# Patient Record
Sex: Female | Born: 1975
Health system: Southern US, Community
[De-identification: ages and names within clinical notes are randomized; demographics above are authoritative.]

## PROBLEM LIST (undated history)

## (undated) DIAGNOSIS — J45909 Unspecified asthma, uncomplicated: Secondary | ICD-10-CM

## (undated) HISTORY — DX: Unspecified asthma, uncomplicated: J45.909

## (undated) HISTORY — PX: ENDOMETRIAL ABLATION: SHX621

---

## 1999-12-01 ENCOUNTER — Other Ambulatory Visit: Admission: RE | Admit: 1999-12-01 | Discharge: 1999-12-01 | Payer: Self-pay | Admitting: *Deleted

## 2001-03-20 ENCOUNTER — Other Ambulatory Visit: Admission: RE | Admit: 2001-03-20 | Discharge: 2001-03-20 | Payer: Self-pay | Admitting: *Deleted

## 2002-07-26 ENCOUNTER — Other Ambulatory Visit: Admission: RE | Admit: 2002-07-26 | Discharge: 2002-07-26 | Payer: Self-pay | Admitting: *Deleted

## 2012-02-15 ENCOUNTER — Ambulatory Visit: Payer: Self-pay | Admitting: Family Medicine

## 2012-02-15 DIAGNOSIS — Z0289 Encounter for other administrative examinations: Secondary | ICD-10-CM

## 2014-10-10 ENCOUNTER — Encounter (HOSPITAL_COMMUNITY): Payer: Self-pay | Admitting: Emergency Medicine

## 2014-10-10 ENCOUNTER — Emergency Department (HOSPITAL_COMMUNITY)
Admission: EM | Admit: 2014-10-10 | Discharge: 2014-10-10 | Disposition: A | Discharge status: Home / Self Care | Payer: 59 | Attending: Emergency Medicine | Admitting: Emergency Medicine

## 2014-10-10 DIAGNOSIS — S61411A Laceration without foreign body of right hand, initial encounter: Secondary | ICD-10-CM | POA: Insufficient documentation

## 2014-10-10 DIAGNOSIS — W01198A Fall on same level from slipping, tripping and stumbling with subsequent striking against other object, initial encounter: Secondary | ICD-10-CM | POA: Insufficient documentation

## 2014-10-10 DIAGNOSIS — Z23 Encounter for immunization: Secondary | ICD-10-CM | POA: Diagnosis not present

## 2014-10-10 DIAGNOSIS — Y998 Other external cause status: Secondary | ICD-10-CM | POA: Diagnosis not present

## 2014-10-10 DIAGNOSIS — Y92009 Unspecified place in unspecified non-institutional (private) residence as the place of occurrence of the external cause: Secondary | ICD-10-CM | POA: Insufficient documentation

## 2014-10-10 DIAGNOSIS — Y9389 Activity, other specified: Secondary | ICD-10-CM | POA: Insufficient documentation

## 2014-10-10 MED ORDER — BACITRACIN ZINC 500 UNIT/GM EX OINT
1.0000 "application " | TOPICAL_OINTMENT | Freq: Two times a day (BID) | CUTANEOUS | Status: DC
  Administered 2014-10-10: 1 via TOPICAL
  Filled 2014-10-10: qty 0.9

## 2014-10-10 MED ORDER — LIDOCAINE-EPINEPHRINE 1 %-1:100000 IJ SOLN
10.0000 mL | Freq: Once | INTRAMUSCULAR | Status: AC
  Administered 2014-10-10: 10 mL
  Filled 2014-10-10: qty 1

## 2014-10-10 MED ORDER — TETANUS-DIPHTH-ACELL PERTUSSIS 5-2.5-18.5 LF-MCG/0.5 IM SUSP
0.5000 mL | Freq: Once | INTRAMUSCULAR | Status: AC
  Administered 2014-10-10: 0.5 mL via INTRAMUSCULAR
  Filled 2014-10-10: qty 0.5

## 2014-10-10 NOTE — ED Notes (Signed)
Family at bedside. 

## 2014-10-10 NOTE — ED Notes (Signed)
Patient c/o tripping this am, fell and cut her hand on a metal handle.

## 2014-10-10 NOTE — ED Provider Notes (Signed)
CSN: [id number]     Arrival date & time 10/10/14  0907 History   First MD Initiated Contact with Patient 10/10/14 [phone]     Chief Complaint  Patient presents with  . Fall     (Consider location/radiation/quality/duration/timing/severity/associated sxs/prior Treatment) HPI Comments: 39 year old female who presents with left hand laceration. Just prior to arrival, the patient tripped in her house and fell forward, cutting her hand on a metal handle of a piece of furniture. She did not lose consciousness or sustain any other injuries. She endorses moderate, constant pain at the laceration site. Mild numbness of her right fifth finger but normal range of motion of her fingers and hands. Unknown last tetanus vaccination. She is right-handed.  Patient is a 39 y.o. female presenting with fall. The history is provided by the patient.  Fall    History reviewed. No pertinent past medical history. Past Surgical History  Procedure Laterality Date  . Endometrial ablation     History reviewed. No pertinent family history. Social History  Substance Use Topics  . Smoking status: Never Smoker   . Smokeless tobacco: None  . Alcohol Use: 4.8 oz/week    8 Glasses of wine per week   OB History    No data available     Review of Systems  10 Systems reviewed and are negative for acute change except as noted in the HPI.   Allergies  Sulfa antibiotics  Home Medications   Prior to Admission medications   Not on File   BP 138/93 mmHg  Pulse 72  Temp(Src) 97.8 F (36.6 C) (Oral)  Resp 24  Ht  (1.727 m)  Wt 200 lb (90.719 kg)  BMI 30.42 kg/m2  SpO2 96%  LMP 09/19/2014 (Approximate) Physical Exam  Constitutional: She appears well-developed and well-nourished.  Tearful but NAD  HENT:  Head: Normocephalic and atraumatic.  Nose: Nose normal.  Musculoskeletal:  Normal strength of flexion/extension at DIP and PIP joints of fingers 2-5 R hand, mildly decreased sensation R 5th finger;  2+ radial pulse  Skin:     2.5 cm laceration on ulnar side of R hand 1cm proximal to base of 5th finger, no tendon exposure, hemostatic  Nursing note and vitals reviewed.   ED Course  LACERATION REPAIR Date/Time: 10/10/2014 11:12 AM Performed by: Laurence Spates Authorized by: Laurence Spates Consent: Verbal consent obtained. Risks and benefits: risks, benefits and alternatives were discussed Consent given by: patient Patient understanding: patient states understanding of the procedure being performed Patient consent: the patient's understanding of the procedure matches consent given Procedure consent: procedure consent matches procedure scheduled Patient identity confirmed: verbally with patient Body area: upper extremity Location details: right hand Laceration length: 2.5 cm Foreign bodies: no foreign bodies Tendon involvement: none Nerve involvement: none Vascular damage: no Anesthesia: local infiltration Local anesthetic: lidocaine 1% with epinephrine Anesthetic total: 5 ml Patient sedated: no Preparation: Patient was prepped and draped in the usual sterile fashion. Irrigation solution: saline Irrigation method: jet lavage Amount of cleaning: standard Debridement: moderate Degree of undermining: none Skin closure: 4-0 nylon Subcutaneous closure: 4-0 Vicryl Number of sutures: 8 Technique: simple Approximation: close Approximation difficulty: simple Dressing: antibiotic ointment Patient tolerance: Patient tolerated the procedure well with no immediate complications   (including critical care time) Labs Review Labs Reviewed - No data to display  Medications  bacitracin ointment 1 application (not administered)  Tdap (BOOSTRIX) injection 0.5 mL (0.5 mLs Intramuscular Given 10/10/14 0950)  lidocaine-EPINEPHrine (XYLOCAINE W/EPI) 1 %-1:100000 (  with pres) injection 10 mL (10 mLs Other Given 10/10/14 1610)     MDM   Final diagnoses:  Hand laceration,  right, initial encounter    39 year old female who presents with right hand laceration after she fell against a piece of metal on furniture. Patient with 2.5 cm laceration to ulnar side of the palmar surface of right hand, proximal to the fifth MCP joint. Normal range of motion and strength of all fingers. Slightly diminished sensation of her fifth finger. No evidence of tendon, nerve, or vascular damage. Performed repair at bedside after irrigation and cleaning wound. See his procedure note for details. Updated tetanus vaccination. Reviewed return precautions including signs of infection. Patient voiced understanding. Instructed to follow-up with PCP versus hand surgeon if any concerns. Patient discharged in satisfactory condition.    Laurence Spates, MD 10/10/14 1115

## 2014-10-25 ENCOUNTER — Encounter (HOSPITAL_COMMUNITY): Payer: Self-pay | Admitting: Emergency Medicine

## 2014-10-25 ENCOUNTER — Emergency Department (HOSPITAL_COMMUNITY)
Admission: EM | Admit: 2014-10-25 | Discharge: 2014-10-25 | Disposition: A | Discharge status: Home / Self Care | Payer: 59 | Attending: Emergency Medicine | Admitting: Emergency Medicine

## 2014-10-25 DIAGNOSIS — Z4802 Encounter for removal of sutures: Secondary | ICD-10-CM | POA: Insufficient documentation

## 2014-10-25 NOTE — Discharge Instructions (Signed)
Wash the affected area with soap and water and apply a thin layer of topical antibiotic ointment. Do this every 12 hours.   Do not use rubbing alcohol or hydrogen peroxide.                        Look for signs of infection: if you see redness, if the area becomes warm, if pain increases sharply, there is discharge (pus), if red streaks appear or you develop fever or vomiting, RETURN immediately to the Emergency Department  for a recheck.    Suture Removal, Care After Refer to this sheet in the next few weeks. These instructions provide you with information on caring for yourself after your procedure. Your health care provider may also give you more specific instructions. Your treatment has been planned according to current medical practices, but problems sometimes occur. Call your health care provider if you have any problems or questions after your procedure. WHAT TO EXPECT AFTER THE PROCEDURE After your stitches (sutures) are removed, it is typical to have the following:  Some discomfort and swelling in the wound area.  Slight redness in the area. HOME CARE INSTRUCTIONS   If you have skin adhesive strips over the wound area, do not take the strips off. They will fall off on their own in a few days. If the strips remain in place after 14 days, you may remove them.  Change any bandages (dressings) at least once a day or as directed by your health care provider. If the bandage sticks, soak it off with warm, soapy water.  Apply cream or ointment only as directed by your health care provider. If using cream or ointment, wash the area with soap and water 2 times a day to remove all the cream or ointment. Rinse off the soap and pat the area dry with a clean towel.  Keep the wound area dry and clean. If the bandage becomes wet or dirty, or if it develops a bad smell, change it as soon as possible.  Continue to protect the wound from injury.  Use sunscreen when out in the sun. New scars become  sunburned easily. SEEK MEDICAL CARE IF:  You have increasing redness, swelling, or pain in the wound.  You see pus coming from the wound.  You have a fever.  You notice a bad smell coming from the wound or dressing.  Your wound breaks open (edges not staying together).   This information is not intended to replace advice given to you by your health care provider. Make sure you discuss any questions you have with your health care provider.   Document Released: 09/28/2000 Document Revised: 10/24/2012 Document Reviewed: 08/15/2012 Elsevier Interactive Patient Education [employer]

## 2014-10-25 NOTE — ED Provider Notes (Signed)
CSN: [id number]     Arrival date & time 10/25/14  1410 History  By signing my name below, I, Julia Randall, attest that this documentation has been prepared under the direction and in the presence of [employer], PA-C. Electronically Signed: Elon Randall ED Scribe. 10/25/2014. 2:38 PM.    Chief Complaint  Patient presents with  . Suture / Staple Removal   The history is provided by the patient. No language interpreter was used.   HPI Comments: Julia Randall is a 39 y.o. female who presents to the Emergency Department requesting removal of 8 sutures on the left palm placed on 9/23 in the ED.  The patient denies any pain or drainage but reports her pinky still feels as if it is asleep.  She has kept the complaint clean with soap and water as well as used daily neosporin and bacitracin.   History reviewed. No pertinent past medical history. Past Surgical History  Procedure Laterality Date  . Endometrial ablation     History reviewed. No pertinent family history. Social History  Substance Use Topics  . Smoking status: Never Smoker   . Smokeless tobacco: None  . Alcohol Use: 4.8 oz/week    8 Glasses of wine per week   OB History    No data available     Review of Systems  A complete 10 system review of systems was obtained and all systems are negative except as noted in the HPI and PMH.    Allergies  Sulfa antibiotics  Home Medications   Prior to Admission medications   Not on File   BP 131/89 mmHg  Pulse 75  Temp(Src) 98 F (36.7 C) (Oral)  Resp 18  Ht  (1.727 m)  Wt 200 lb (90.719 kg)  BMI 30.42 kg/m2  SpO2 96%  LMP 10/11/2014 Physical Exam  Constitutional: She is oriented to person, place, and time. She appears well-developed and well-nourished. No distress.  HENT:  Head: Normocephalic and atraumatic.  Eyes: Conjunctivae and EOM are normal.  Neck: Neck supple. No tracheal deviation present.  Cardiovascular: Normal rate.   Pulmonary/Chest: Effort  normal. No respiratory distress.  Musculoskeletal: Normal range of motion.  Neurological: She is alert and oriented to person, place, and time.  Skin: Skin is warm and dry.  8 vicryl sutures in place to right hand on the ulnar side.  Clean dry and intact.   Neurovacularly intact with full ROM and strength to each interphalangeal joint (tested in isolation) in both flexion and extension.    Psychiatric: She has a normal mood and affect. Her behavior is normal.  Nursing note and vitals reviewed.   ED Course  Procedures (including critical care time) DIAGNOSTIC STUDIES: Oxygen Saturation is 96% on RA, normal by my interpretation.    COORDINATION OF CARE:  2:34 PM Will perform suture removal.  Patient acknowledges and agrees with plan.    Labs Review Labs Reviewed - No data to display  Imaging Review No results found. I have personally reviewed and evaluated these images and lab results as part of my medical decision-making.   EKG Interpretation None      MDM   Final diagnoses:  Visit for suture removal    Filed Vitals:   10/25/14 1423  BP: 131/89  Pulse: 75  Temp: 98 F (36.7 C)  TempSrc: Oral  Resp: 18  Height:  (1.727 m)  Weight: 200 lb (90.719 kg)  SpO2: 96%    Julia Randall is a pleasant 39  y.o. female presenting for suture removal. Wound is well healing, no signs of infection patient reports persistent pins and needles paresthesia to fifth digit. Advised her to follow with her primary care physician on that.   Evaluation does not show pathology that would require ongoing emergent intervention or inpatient treatment. Pt is hemodynamically stable and mentating appropriately. Discussed findings and plan with patient/guardian, who agrees with care plan. All questions answered. Return precautions discussed and outpatient follow up given.   I personally performed the services described in this documentation, which was scribed in my presence. The recorded  information has been reviewed and is accurate.      Wynetta Emery, PA-C 10/25/14 1522  Mancel Bale, MD 10/25/14 [phone]

## 2014-10-25 NOTE — ED Notes (Signed)
Pt returned to have sutures removed from rt palm. No redness/swelling noted. (+)PMS, CRT brisk. Edges approximated. No drainage.

## 2016-02-09 ENCOUNTER — Ambulatory Visit (INDEPENDENT_AMBULATORY_CARE_PROVIDER_SITE_OTHER): Payer: 59 | Admitting: Internal Medicine

## 2016-02-09 ENCOUNTER — Ambulatory Visit (INDEPENDENT_AMBULATORY_CARE_PROVIDER_SITE_OTHER)
Admission: RE | Admit: 2016-02-09 | Discharge: 2016-02-09 | Disposition: A | Discharge status: Home / Self Care | Payer: 59 | Source: Ambulatory Visit | Attending: Internal Medicine | Admitting: Internal Medicine

## 2016-02-09 ENCOUNTER — Encounter: Payer: Self-pay | Admitting: Internal Medicine

## 2016-02-09 VITALS — BP 116/72 | HR 77 | Ht 68.0 in | Wt 189.8 lb

## 2016-02-09 DIAGNOSIS — J45991 Cough variant asthma: Secondary | ICD-10-CM

## 2016-02-09 LAB — NITRIC OXIDE: Nitric Oxide: 10

## 2016-02-09 MED ORDER — BUDESONIDE-FORMOTEROL FUMARATE 80-4.5 MCG/ACT IN AERO: 2.0000 | INHALATION_SPRAY | Freq: Two times a day (BID) | RESPIRATORY_TRACT | 0 refills | Status: DC

## 2016-02-09 MED ORDER — BUDESONIDE-FORMOTEROL FUMARATE 80-4.5 MCG/ACT IN AERO: 2.0000 | INHALATION_SPRAY | Freq: Two times a day (BID) | RESPIRATORY_TRACT | 11 refills | Status: DC

## 2016-02-09 NOTE — Patient Instructions (Addendum)
Stop BREO  Plan A = Automatic = symbicort 80 Take 2 puffs first thing in am and then another 2 puffs about 12 hours later.   Plan B = Backup Only use your albuterol as a rescue medication to be used if you can't catch your breath by resting or doing a relaxed purse lip breathing pattern.  - The less you use it, the better it will work when you need it. - Ok to use the inhaler up to 2 puffs  every 4 hours if you must but call for appointment if use goes up over your usual need - Don't leave home without it !!  (think of it like the spare tire for your car)    If cough starts back up when you stop the delsym > Try prilosec otc 20mg   Take 30-60 min before first meal of the day and Pepcid ac (famotidine) 20 mg one @  bedtime until cough is completely gone for at least a week without the need for cough suppression  GERD (REFLUX)  is an extremely common cause of respiratory symptoms just like yours , many times with no obvious heartburn at all.    It can be treated with medication, but also with lifestyle changes including elevation of the head of your bed (ideally with 6 inch  bed blocks),  Smoking cessation, avoidance of late meals, excessive alcohol, and avoid fatty foods, chocolate, peppermint, colas, red wine, and acidic juices such as orange juice.  NO MINT OR MENTHOL PRODUCTS SO NO COUGH DROPS   USE SUGARLESS CANDY INSTEAD (Jolley ranchers or Stover's or Life Savers) or even ice chips will also do - the key is to swallow to prevent all throat clearing. NO OIL BASED VITAMINS - use powdered substitutes.   Please remember to go to the x-ray department downstairs for your tests - we will call you with the results when they are available.     Please schedule a follow up office visit in 6 weeks, call sooner if needed

## 2016-02-09 NOTE — Progress Notes (Signed)
Subjective:    Patient ID: Julia Randall, female    DOB: 1975/11/20,    MRN: [id number]  HPI  53 yowf never regular smoker with lots throat infections as child growing up in [place] but grew out of it HS but by college at [employer] with freq college episodes of bronchitis better back home then moved to [place] permanently in 2011 and pattern of recurrent rhinitis/  Bronchitis initially some better on zyrtec but then stopped working   so eval by [facility] started shots and did fine until right before xmas 2017 with recurrent typical of her bronchtis > rx zpak, prednisone and self- referred to pulmonary clinic    02/09/2016 1st [facility] office visit/ [facility]   maint  On Breo   Chief Complaint  Patient presents with  . Pulm Consult    Per patient, she keeps getting bronchitis. Rate is 3-4 times a year. Has been tested with [facility] and has multiple allergies. Had pneumonia last year as well.   abruptly ill with  Cough and nasal congestio 12/22/17still present p zpak no change day vs noct Also feels tight in chest using albuterol pre ex but was using it once a day at most during flareand didn't know how much she could take it while coughing/wheezing .  Still needing delsym to control daytime coughing.   No obvious day to day or daytime variability or assoc excess/ purulent sputum or mucus plugs or hemoptysis or cp or chest tightness, subjective wheeze or overt   hb symptoms. No unusual exp hx or h/o childhood pna/ asthma or knowledge of premature birth.    Also denies any obvious fluctuation of symptoms with weather or environmental changes or other aggravating or alleviating factors except as outlined above   Current Medications, Allergies, Complete Past Medical History, Past Surgical History, Family History, and Social History were reviewed in [employer] record.               Review of Systems  Constitutional: Negative for fever and  unexpected weight change.  HENT: Positive for congestion. Negative for dental problem, ear pain, nosebleeds, postnasal drip, rhinorrhea, sinus pressure, sneezing, sore throat and trouble swallowing.   Eyes: Negative for redness and itching.  Respiratory: Positive for cough and shortness of breath. Negative for chest tightness and wheezing.   Cardiovascular: Negative for palpitations and leg swelling.  Gastrointestinal: Negative for nausea and vomiting.  Genitourinary: Negative for dysuria.  Musculoskeletal: Negative for joint swelling.  Skin: Negative for rash.  Neurological: Negative for headaches.  Hematological: Does not bruise/bleed easily.  Psychiatric/Behavioral: Negative for dysphoric mood. The patient is nervous/anxious.        Objective:   Physical Exam Pleasant amb wf nad  Wt Readings from Last 3 Encounters:  02/09/16 189 lb 12.8 oz (86.1 kg)  10/25/14 200 lb (90.7 kg)  10/10/14 200 lb (90.7 kg)    Vital signs reviewed - Note on arrival 02 sats  97% on RA    HEENT: nl dentition, turbinates, and oropharynx. Nl external ear canals without cough reflex   NECK :  without JVD/Nodes/TM/ nl carotid upstrokes bilaterally   LUNGS: no acc muscle use,  Nl contour chest with minimal rhonchi  bilaterally with  cough on insp     CV:  RRR  no s3 or murmur or increase in P2, nad no edema   ABD:  soft and nontender with nl inspiratory excursion in the supine position. No bruits or organomegaly  appreciated, bowel sounds nl  MS:  Nl gait/ ext warm without deformities, calf tenderness, cyanosis or clubbing No obvious joint restrictions   SKIN: warm and dry without lesions    NEURO:  alert, approp, nl sensorium with  no motor or cerebellar deficits apparent.     CXR PA and Lateral:   02/09/2016 :    I personally reviewed images and agree with radiology impression as follows:    Mild hyperinflation consistent with known reactive airway disease. No alveolar pneumonia nor other  acute cardiopulmonary abnormality.       Assessment & Plan:

## 2016-02-09 NOTE — Progress Notes (Signed)
Spoke with pt and notified of results per Dr. Wert. Pt verbalized understanding and denied any questions. 

## 2016-02-10 NOTE — Assessment & Plan Note (Signed)
FENO 02/09/2016  =   10 on BREO maint/ done during flare of cough  - Spirometry 02/09/2016  wnl s am breo  - 02/09/2016  After extensive coaching HFA effectiveness =   75% > rec stop breo and start symb 80 2bid  Symptoms are markedly disproportionate to objective findings and not clear this is a lung problem but pt does appear to have difficult airway management issues. DDX of  difficult airways management almost all start with A and  include Adherence, Ace Inhibitors, Acid Reflux, Active Sinus Disease, Alpha 1 Antitripsin deficiency, Anxiety masquerading as Airways dz,  ABPA,  Allergy(esp in young), Aspiration (esp in elderly), Adverse effects of meds,  Active smokers, A bunch of PE's (a small clot burden can't cause this syndrome unless there is already severe underlying pulm or vascular dz with poor reserve) plus two Bs  = Bronchiectasis and Beta blocker use..and one C= CHF   Adherence is always the initial "prime suspect" and is a multilayered concern that requires a "trust but verify" approach in every patient - starting with knowing how to use medications, especially inhalers, correctly, keeping up with refills and understanding the fundamental difference between maintenance and prns vs those medications only taken for a very short course and then stopped and not refilled.   - The proper method of use, as well as anticipated side effects, of a metered-dose inhaler are discussed and demonstrated to the patient. Improved effectiveness after extensive coaching during this visit to a level of approximately 75 % from a baseline of 50 % > ok to use hfa as maint and prn    ? Adverse effects of dpi > try off breo and on low dose ICS = symb 80 2bid   ? Allergy > defer rx to [facility]  ? Acid (or non-acid) GERD > always difficult to exclude as up to 75% of pts in some series report no assoc GI/ Heartburn symptoms> rec next add   max (24h)  acid suppression and diet restrictions/ reviewed and  instructions given in writing.   NB: Of the three most common causes of chronic cough, only one (GERD)  can actually contribute  the other two (asthma and post nasal drip syndrome)  and perpetuate the cylce of cough inducing airway trauma, inflammation, heightened sensitivity to reflux which is prompted by the cough itself via a cyclical mechanism.    This may partially respond to steroids and look like asthma and post nasal drainage but never erradicated completely unless the cough and the secondary reflux are eliminated, preferably both at the same time.  While not intuitively obvious, many patients with chronic low grade reflux do not cough until there is a secondary insult that disturbs the protective epithelial barrier and exposes sensitive nerve endings.  This can be viral or direct physical injury such as with an endotracheal tube.   The point is that once this occurs, it is difficult to eliminate using anything but a maximally effective acid suppression regimen at least in the short run, accompanied by an appropriate diet to address non acid GERD.    ? Active sinus dz > consider sinus ct next   Total time devoted to counseling  > 50 % of 60 min initial office visit:  review case with pt/ discussion of options/alternatives/ personally creating written customized instructions  in presence of pt  then going over those specific  Instructions directly with the pt including how to use all of the meds but in  particular covering each new medication in detail and the difference between the maintenance/automatic meds and the prns using an action plan format for the latter.  Please see AVS from this visit for a full list of these instructions which I personally wrote for this pt and  are unique to this visit.

## 2016-03-09 ENCOUNTER — Encounter: Payer: Self-pay | Admitting: Internal Medicine

## 2016-03-22 ENCOUNTER — Ambulatory Visit: Payer: 59 | Admitting: Internal Medicine

## 2016-03-29 ENCOUNTER — Encounter: Payer: Self-pay | Admitting: Internal Medicine

## 2016-03-29 ENCOUNTER — Ambulatory Visit (INDEPENDENT_AMBULATORY_CARE_PROVIDER_SITE_OTHER): Payer: 59 | Admitting: Internal Medicine

## 2016-03-29 VITALS — BP 114/70 | HR 70 | Ht 65.0 in | Wt 185.6 lb

## 2016-03-29 DIAGNOSIS — J45991 Cough variant asthma: Secondary | ICD-10-CM | POA: Diagnosis not present

## 2016-03-29 MED ORDER — BUDESONIDE-FORMOTEROL FUMARATE 80-4.5 MCG/ACT IN AERO: 2.0000 | INHALATION_SPRAY | Freq: Two times a day (BID) | RESPIRATORY_TRACT | 0 refills | Status: DC

## 2016-03-29 MED ORDER — BUDESONIDE-FORMOTEROL FUMARATE 80-4.5 MCG/ACT IN AERO: INHALATION_SPRAY | RESPIRATORY_TRACT | 11 refills | Status: AC

## 2016-03-29 NOTE — Patient Instructions (Addendum)
Plan A = Automatic = Symbicort 80 2 pffs each am automatically and the pm dose is optional   Work on perfect  inhaler technique:  relax and gently blow all the way out then take a nice smooth deep breath back in, triggering the inhaler at same time you start breathing in.  Hold for up to 5 seconds if you can. Blow out thru nose. Rinse and gargle with water when done - brush teeth and gargle with arm and hammer    Plan B = Backup Only use your albuterol as a rescue medication to be used if you can't catch your breath by resting or doing a relaxed purse lip breathing pattern.  - The less you use it, the better it will work when you need it. - Ok to use the inhaler up to 2 puffs  every 4 hours if you must but call for appointment if use goes up over your usual need - Don't leave home without it !!  (think of it like the spare tire for your car)   If doing great try to drop off prilosec(omeprazole)  and just use pepcid 20 mg twice daily x one week then try off am dose x one week  And stop it completely after another week, resume full acid suppression then start over   Please schedule a follow up visit in 6  months but call sooner if needed

## 2016-03-29 NOTE — Progress Notes (Signed)
Subjective:   Patient ID: Julia Randall, female    DOB: Jun 05, 1975,    MRN: [id number]    Brief patient profile:  [age] yowf never regular smoker with lots throat infections as child growing up in [place] but grew out of it HS but by college at [employer] with freq college episodes of bronchitis better back home then moved to [place] permanently in 2011 and pattern of recurrent rhinitis/  Bronchitis initially some better on zyrtec but then stopped working   so eval by Allergy [facility] ChapelWhelan Pos started shots and did fine until right before xmas 2017 with recurrent typical of her bronchitis > rx zpak, prednisone and self- referred to pulmonary clinic     History of Present Illness  02/09/2016 1st [facility] office visit/ Andren Bethea   maint  On Breo  Chief Complaint  Patient presents with  . Pulm Consult    Per patient, she keeps getting bronchitis. Rate is 3-4 times a year. Has been tested with [facility] and has multiple allergies. Had pneumonia last year as well.   abruptly ill with  Cough and nasal congestio 12/22/17still present p zpak no change day vs noct Also feels tight in chest using albuterol pre ex but was using it once a day at most during flareand didn't know how much she could take it while coughing/wheezing .  Still needing delsym to control daytime coughing. rec Stop BREO Plan A = Automatic = symbicort 80 Take 2 puffs first thing in am and then another 2 puffs about 12 hours later.  Plan B = Backup Only use your albuterol as a rescue medication  If cough starts back up when you stop the delsym > Try prilosec otc 20mg   Take 30-60 min before first meal of the day and Pepcid ac (famotidine) 20 mg one @  bedtime until cough is completely gone for at least a week without the need for cough suppression GERD    Please schedule a follow up office visit in 6 weeks, call sooner if needed     03/29/2016  f/u ov/Joahan Swatzell re: cough variant asthma vs uacs better off breo but thrush on symb 80 2bid  /priolosec/ pepcid Chief Complaint  Patient presents with  . Follow-up    Breathing is doing well and no co's today. She has only used albuterol once since she started on Symbicort. She states she developed thrush after starting Symbicort and was txed with Diflucan by UC.     Not limited by breathing from desired activities  And no need for saba at all while on symb 80/ quite pleased about that  No obvious day to day or daytime variability or assoc excess/ purulent sputum or mucus plugs or hemoptysis or cp or chest tightness, subjective wheeze or overt sinus or hb symptoms. No unusual exp hx or h/o childhood pna/ asthma or knowledge of premature birth.  Sleeping ok without nocturnal  or early am exacerbation  of respiratory  c/o's or need for noct saba. Also denies any obvious fluctuation of symptoms with weather or environmental changes or other aggravating or alleviating factors except as outlined above   Current Medications, Allergies, Complete Past Medical History, Past Surgical History, Family History, and Social History were reviewed in [facility] electronic medical record.  ROS  The following are not active complaints unless bolded sore throat, dysphagia, dental problems, itching, sneezing,  nasal congestion or excess/ purulent secretions, ear ache,   fever, chills, sweats, unintended wt loss, classically pleuritic or exertional  cp,  orthopnea pnd or leg swelling, presyncope, palpitations, abdominal pain, anorexia, nausea, vomiting, diarrhea  or change in bowel or bladder habits, change in stools or urine, dysuria,hematuria,  rash, arthralgias, visual complaints, headache, numbness, weakness or ataxia or problems with walking or coordination,  change in mood/affect or memory.                      Objective:   Physical Exam   Pleasant amb wf nad  03/29/2016       186   02/09/16 189 lb 12.8 oz (86.1 kg)  10/25/14 200 lb (90.7 kg)  10/10/14 200 lb (90.7 kg)    Vital signs  reviewed - Note on arrival 02 sats  99% on RA    HEENT: nl dentition, turbinates, and oropharynx s residual thrush. Nl external ear canals without cough reflex   NECK :  without JVD/Nodes/TM/ nl carotid upstrokes bilaterally   LUNGS: no acc muscle use,  Nl contour chest / clear to A and P    CV:  RRR  no s3 or murmur or increase in P2, nad no edema   ABD:  soft and nontender with nl inspiratory excursion in the supine position. No bruits or organomegaly appreciated, bowel sounds nl  MS:  Nl gait/ ext warm without deformities, calf tenderness, cyanosis or clubbing No obvious joint restrictions   SKIN: warm and dry without lesions    NEURO:  alert, approp, nl sensorium with  no motor or cerebellar deficits apparent.     CXR PA and Lateral:   02/09/2016 :    I personally reviewed images and agree with radiology impression as follows:    Mild hyperinflation consistent with known reactive airway disease. No alveolar pneumonia nor other acute cardiopulmonary abnormality.       Assessment & Plan:

## 2016-03-30 NOTE — Assessment & Plan Note (Signed)
FENO 02/09/2016  =   10 on BREO maint/ done during flare of cough  - Spirometry 02/09/2016  wnl s am breo  - 02/09/2016    75% > rec stop breo and start symb 80 2bid   - 03/29/2016  After extensive coaching HFA effectiveness =    90%   She did not follow instructions re rinsing and blowing symb out through the nose so should add the use of baking soda tootpaste now (despite being a [employer] graduate) to her regimen and reducing the symbicort to 80 x 2 pffs each am and if thrush recurs add spacer  However, note that All goals of chronic asthma control met including optimal function and elimination of symptoms with minimal need for rescue therapy and it may be feasible to gradually wean off the gerd rx if her symptoms remain in such good control.  Contingencies discussed in full including contacting this office immediately if not controlling the symptoms using the rule of two's.     I had an extended discussion with the patient reviewing all relevant studies completed to date and  lasting 15 to 20 minutes of a 25 minute visit    Each maintenance medication was reviewed in detail including most importantly the difference between maintenance and prns and under what circumstances the prns are to be triggered using an action plan format that is not reflected in the computer generated alphabetically organized AVS.    Please see AVS for specific instructions unique to this visit that I personally wrote and verbalized to the the pt in detail and then reviewed with pt  by my nurse highlighting any  changes in therapy recommended at today's visit to their plan of care.

## 2016-09-30 ENCOUNTER — Ambulatory Visit: Payer: 59 | Admitting: Internal Medicine

## 2016-10-18 ENCOUNTER — Encounter: Payer: Self-pay | Admitting: Internal Medicine

## 2016-10-18 ENCOUNTER — Ambulatory Visit (INDEPENDENT_AMBULATORY_CARE_PROVIDER_SITE_OTHER): Payer: 59 | Admitting: Internal Medicine

## 2016-10-18 VITALS — BP 106/80 | HR 66 | Ht 68.0 in | Wt 182.0 lb

## 2016-10-18 DIAGNOSIS — J45991 Cough variant asthma: Secondary | ICD-10-CM

## 2016-10-18 NOTE — Progress Notes (Signed)
Subjective:   Patient ID: Julia Randall, female    DOB: [birth year],    MRN: [id number]   Brief patient profile:  29 yowf never regular smoker with lots throat infections as child growing up in [place] but grew out of it HS but by college at [employer] with freq   episodes of bronchitis better back home then moved to [place] permanently in 2011 and pattern of recurrent rhinitis/  Bronchitis initially some better on zyrtec but then stopped working   so eval by Allergy Barnetta Chapel Pos started shots and did fine until  xmas 2017 with recurrent cough typical of her bronchitis > rx zpak, prednisone and self- referred to pulmonary clinic     History of Present Illness  02/09/2016 1st [facility] office visit/ Christiana Gurevich   maint  On Breo  Chief Complaint  Patient presents with  . Pulm Consult    Per patient, she keeps getting bronchitis. Rate is 3-4 times a year. Has been tested with [facility] and has multiple allergies. Had pneumonia last year as well.   abruptly ill with  Cough and nasal congestio 12/22/17still present p zpak no change day vs noct Also feels tight in chest using albuterol pre ex but was using it once a day at most during flareand didn't know how much she could take it while coughing/wheezing .  Still needing delsym to control daytime coughing. rec Stop BREO Plan A = Automatic = symbicort 80 Take 2 puffs first thing in am and then another 2 puffs about 12 hours later.  Plan B = Backup Only use your albuterol as a rescue medication  If cough starts back up when you stop the delsym > Try prilosec otc   Take 30-60 min before first meal of the day and Pepcid ac (famotidine) 20 mg one @  bedtime until cough is completely gone for at least a week without the need for cough suppression GERD    Please schedule a follow up office visit in 6 weeks, call sooner if needed     03/29/2016  f/u ov/Davan Hark re: cough variant asthma vs uacs better off breo but thrush on symb 80 2bid /priolosec/  pepcid Chief Complaint  Patient presents with  . Follow-up    Breathing is doing well and no co's today. She has only used albuterol once since she started on Symbicort. She states she developed thrush after starting Symbicort and was txed with Diflucan by UC.    Not limited by breathing from desired activities  And no need for saba at all while on symb 80/ quite pleased about that rec Plan A = Automatic = Symbicort 80 2 pffs each am automatically and the pm dose is optional  Work on perfect  inhaler technique:  relax and gently blow all the way out then take a nice smooth deep breath back in, triggering the inhaler at same time you start breathing in.  Hold for up to 5 seconds if you can. Blow out thru nose. Rinse and gargle with water when done - brush teeth and gargle with arm and hammer  Plan B = Backup Only use your albuterol as a rescue medication If doing great try to drop off prilosec(omeprazole)  and just use pepcid 20 mg twice daily x one week then try off am dose x one week  And stop it completely after another week, resume full acid suppression then start over    10/18/2016  f/u ov/Dehaven Sine re:  Cough variant asthma/ symb  80 2pffs each am / pepcid 20 mg daily/ no longer ppi  Chief Complaint  Patient presents with  . Follow-up    Her breathing is doing well and no problems today. She rarely has to use her albuterol inhaler.    only needed saba over summer if outdoors a lot/ never noct symptoms or need for saba  Not limited by breathing from desired activities    No obvious day to day or daytime variability or assoc excess/ purulent sputum or mucus plugs or hemoptysis or cp or chest tightness, subjective wheeze or overt sinus or hb symptoms. No unusual exp hx or h/o childhood pna/ asthma or knowledge of premature birth.  Sleeping ok flat without nocturnal  or early am exacerbation  of respiratory  c/o's or need for noct saba. Also denies any obvious fluctuation of symptoms with weather  or environmental changes or other aggravating or alleviating factors except as outlined above   Current Allergies, Complete Past Medical History, Past Surgical History, Family History, and Social History were reviewed in [facility] record.  ROS  The following are not active complaints unless bolded Hoarseness, sore throat, dysphagia, dental problems, itching, sneezing,  nasal congestion or discharge of excess mucus or purulent secretions, ear ache,   fever, chills, sweats, unintended wt loss or wt gain, classically pleuritic or exertional cp,  orthopnea pnd or leg swelling, presyncope, palpitations, abdominal pain, anorexia, nausea, vomiting, diarrhea  or change in bowel habits or change in bladder habits, change in stools or change in urine, dysuria, hematuria,  rash, arthralgias, visual complaints, headache, numbness, weakness or ataxia or problems with walking or coordination,  change in mood/affect or memory.        Current Meds  Medication Sig  . albuterol (PROVENTIL HFA;VENTOLIN HFA) 108 (90 Base) MCG/ACT inhaler Inhale 2 puffs into the lungs every 6 (six) hours as needed for wheezing or shortness of breath.  . Azelastine-Fluticasone (DYMISTA) 137-50 MCG/ACT SUSP Place 1 spray into the nose 2 (two) times daily.  . budesonide-formoterol (SYMBICORT) 80-4.5 MCG/ACT inhaler Take 2 puffs first thing in am and then another 2 puffs about 12 hours later.  . Cholecalciferol (VITAMIN D3 PO) Take 1 capsule by mouth daily.  Marland Kitchen EPINEPHrine 0.3 mg/0.3 mL IJ SOAJ injection Inject 0.3 mg into the muscle once.  . famotidine (PEPCID) 20 MG tablet Take 20 mg by mouth daily.  . GuaiFENesin (MUCUS RELIEF ADULT PO) Take by mouth as needed.  Marland Kitchen ketotifen (ZADITOR) 0.025 % ophthalmic solution Place 1 drop into both eyes 2 (two) times daily.  Marland Kitchen levocetirizine (XYZAL) 5 MG tablet Take 5 mg by mouth every evening.  . Multiple Vitamins-Minerals (MULTIVITAL PO) Take 1 capsule by mouth daily.  .  sertraline (ZOLOFT) 50 MG tablet Take 150 mg by mouth daily.                       Objective:   Physical Exam   Pleasant amb wf nad in L foot boot   03/29/2016       186   02/09/16 189 lb 12.8 oz (86.1 kg)  10/25/14 200 lb (90.7 kg)  10/10/14 200 lb (90.7 kg)    Vital signs reviewed - Note on arrival 02 sats  98% on RA    HEENT: nl dentition, turbinates, and oropharynx.  Nl external ear canals without cough reflex   NECK :  without JVD/Nodes/TM/ nl carotid upstrokes bilaterally   LUNGS: no acc muscle use,  Nl contour chest / clear to A and P bilaterally   CV:  RRR  no s3 or murmur or increase in P2, nad no edema   ABD:  soft and nontender with nl inspiratory excursion in the supine position. No bruits or organomegaly appreciated, bowel sounds nl  MS:  Nl gait/ ext warm without deformities, calf tenderness, cyanosis or clubbing No obvious joint restrictions   SKIN: warm and dry without lesions    NEURO:  alert, approp, nl sensorium with  no motor or cerebellar deficits apparent.           Assessment & Plan:

## 2016-10-18 NOTE — Assessment & Plan Note (Signed)
FENO 02/09/2016  =   10 on BREO maint/ done during flare of cough  - Spirometry 02/09/2016  wnl s am breo  - 02/09/2016    75% > rec stop breo and start symb 80 2bid   - 10/18/2016  After extensive coaching HFA effectiveness =    90% and good control on just symbicort 80 x 2 each am only > pulmonary f/u prn as followed by Orvil Feil on shots    All goals of chronic asthma control met including optimal function and elimination of symptoms with minimal need for rescue therapy.  Contingencies discussed in full including contacting this office immediately if not controlling the symptoms using the rule of two's.        Each maintenance medication was reviewed in detail including most importantly the difference between maintenance and as needed and under what circumstances the prns are to be used.  Please see AVS for specific  Instructions which are unique to this visit and I personally typed out  which were reviewed in detail in writing with the patient and a copy provided.

## 2016-10-18 NOTE — Patient Instructions (Addendum)
No change in medications - return as needed

## 2017-01-31 ENCOUNTER — Other Ambulatory Visit: Payer: Self-pay | Admitting: Obstetrics and Gynecology

## 2017-01-31 DIAGNOSIS — N6489 Other specified disorders of breast: Secondary | ICD-10-CM

## 2017-02-01 ENCOUNTER — Other Ambulatory Visit: Payer: Self-pay | Admitting: Obstetrics and Gynecology

## 2017-02-01 DIAGNOSIS — R928 Other abnormal and inconclusive findings on diagnostic imaging of breast: Secondary | ICD-10-CM

## 2017-02-08 ENCOUNTER — Ambulatory Visit
Admission: RE | Admit: 2017-02-08 | Discharge: 2017-02-08 | Disposition: A | Discharge status: Home / Self Care | Payer: 59 | Source: Ambulatory Visit | Attending: Obstetrics and Gynecology | Admitting: Obstetrics and Gynecology

## 2017-02-08 ENCOUNTER — Other Ambulatory Visit: Payer: Self-pay | Admitting: Obstetrics and Gynecology

## 2017-02-08 DIAGNOSIS — N6489 Other specified disorders of breast: Secondary | ICD-10-CM

## 2017-02-08 DIAGNOSIS — R928 Other abnormal and inconclusive findings on diagnostic imaging of breast: Secondary | ICD-10-CM

## 2017-08-08 ENCOUNTER — Ambulatory Visit: Payer: 59

## 2017-08-08 ENCOUNTER — Ambulatory Visit
Admission: RE | Admit: 2017-08-08 | Discharge: 2017-08-08 | Disposition: A | Discharge status: Home / Self Care | Payer: 59 | Source: Ambulatory Visit | Attending: Obstetrics and Gynecology | Admitting: Obstetrics and Gynecology

## 2017-08-08 ENCOUNTER — Other Ambulatory Visit: Payer: Self-pay | Admitting: Obstetrics and Gynecology

## 2017-08-08 DIAGNOSIS — N6489 Other specified disorders of breast: Secondary | ICD-10-CM

## 2018-01-18 DIAGNOSIS — Z9109 Other allergy status, other than to drugs and biological substances: Secondary | ICD-10-CM | POA: Diagnosis not present

## 2018-01-18 DIAGNOSIS — J4520 Mild intermittent asthma, uncomplicated: Secondary | ICD-10-CM | POA: Diagnosis not present

## 2018-01-18 DIAGNOSIS — F419 Anxiety disorder, unspecified: Secondary | ICD-10-CM | POA: Diagnosis not present

## 2018-01-19 DIAGNOSIS — F1020 Alcohol dependence, uncomplicated: Secondary | ICD-10-CM | POA: Diagnosis not present

## 2018-01-19 DIAGNOSIS — F3340 Major depressive disorder, recurrent, in remission, unspecified: Secondary | ICD-10-CM | POA: Diagnosis not present

## 2018-01-25 DIAGNOSIS — M9903 Segmental and somatic dysfunction of lumbar region: Secondary | ICD-10-CM | POA: Diagnosis not present

## 2018-01-25 DIAGNOSIS — J301 Allergic rhinitis due to pollen: Secondary | ICD-10-CM | POA: Diagnosis not present

## 2018-01-25 DIAGNOSIS — J3089 Other allergic rhinitis: Secondary | ICD-10-CM | POA: Diagnosis not present

## 2018-01-25 DIAGNOSIS — J3081 Allergic rhinitis due to animal (cat) (dog) hair and dander: Secondary | ICD-10-CM | POA: Diagnosis not present

## 2018-01-25 DIAGNOSIS — M9901 Segmental and somatic dysfunction of cervical region: Secondary | ICD-10-CM | POA: Diagnosis not present

## 2018-01-25 DIAGNOSIS — M9902 Segmental and somatic dysfunction of thoracic region: Secondary | ICD-10-CM | POA: Diagnosis not present

## 2018-01-31 DIAGNOSIS — F3340 Major depressive disorder, recurrent, in remission, unspecified: Secondary | ICD-10-CM | POA: Diagnosis not present

## 2018-01-31 DIAGNOSIS — F1020 Alcohol dependence, uncomplicated: Secondary | ICD-10-CM | POA: Diagnosis not present

## 2018-02-06 DIAGNOSIS — F3340 Major depressive disorder, recurrent, in remission, unspecified: Secondary | ICD-10-CM | POA: Diagnosis not present

## 2018-02-06 DIAGNOSIS — F1020 Alcohol dependence, uncomplicated: Secondary | ICD-10-CM | POA: Diagnosis not present

## 2018-02-09 ENCOUNTER — Ambulatory Visit
Admission: RE | Admit: 2018-02-09 | Discharge: 2018-02-09 | Disposition: A | Discharge status: Home / Self Care | Payer: BLUE CROSS/BLUE SHIELD | Source: Ambulatory Visit | Attending: Obstetrics and Gynecology | Admitting: Obstetrics and Gynecology

## 2018-02-09 DIAGNOSIS — N6489 Other specified disorders of breast: Secondary | ICD-10-CM

## 2018-02-09 DIAGNOSIS — R922 Inconclusive mammogram: Secondary | ICD-10-CM | POA: Diagnosis not present

## 2018-02-14 DIAGNOSIS — J3089 Other allergic rhinitis: Secondary | ICD-10-CM | POA: Diagnosis not present

## 2018-02-14 DIAGNOSIS — J301 Allergic rhinitis due to pollen: Secondary | ICD-10-CM | POA: Diagnosis not present

## 2018-02-14 DIAGNOSIS — J3081 Allergic rhinitis due to animal (cat) (dog) hair and dander: Secondary | ICD-10-CM | POA: Diagnosis not present

## 2018-02-14 DIAGNOSIS — J4540 Moderate persistent asthma, uncomplicated: Secondary | ICD-10-CM | POA: Diagnosis not present

## 2018-02-15 DIAGNOSIS — F3340 Major depressive disorder, recurrent, in remission, unspecified: Secondary | ICD-10-CM | POA: Diagnosis not present

## 2018-02-15 DIAGNOSIS — F1020 Alcohol dependence, uncomplicated: Secondary | ICD-10-CM | POA: Diagnosis not present

## 2018-02-21 DIAGNOSIS — J3089 Other allergic rhinitis: Secondary | ICD-10-CM | POA: Diagnosis not present

## 2018-02-21 DIAGNOSIS — J3081 Allergic rhinitis due to animal (cat) (dog) hair and dander: Secondary | ICD-10-CM | POA: Diagnosis not present

## 2018-02-21 DIAGNOSIS — J301 Allergic rhinitis due to pollen: Secondary | ICD-10-CM | POA: Diagnosis not present

## 2018-02-22 DIAGNOSIS — M9901 Segmental and somatic dysfunction of cervical region: Secondary | ICD-10-CM | POA: Diagnosis not present

## 2018-02-22 DIAGNOSIS — M9902 Segmental and somatic dysfunction of thoracic region: Secondary | ICD-10-CM | POA: Diagnosis not present

## 2018-02-22 DIAGNOSIS — M9903 Segmental and somatic dysfunction of lumbar region: Secondary | ICD-10-CM | POA: Diagnosis not present

## 2018-02-23 DIAGNOSIS — F1020 Alcohol dependence, uncomplicated: Secondary | ICD-10-CM | POA: Diagnosis not present

## 2018-02-23 DIAGNOSIS — F3340 Major depressive disorder, recurrent, in remission, unspecified: Secondary | ICD-10-CM | POA: Diagnosis not present

## 2018-02-27 DIAGNOSIS — F1020 Alcohol dependence, uncomplicated: Secondary | ICD-10-CM | POA: Diagnosis not present

## 2018-02-27 DIAGNOSIS — F3340 Major depressive disorder, recurrent, in remission, unspecified: Secondary | ICD-10-CM | POA: Diagnosis not present

## 2018-02-28 DIAGNOSIS — J301 Allergic rhinitis due to pollen: Secondary | ICD-10-CM | POA: Diagnosis not present

## 2018-02-28 DIAGNOSIS — J3081 Allergic rhinitis due to animal (cat) (dog) hair and dander: Secondary | ICD-10-CM | POA: Diagnosis not present

## 2018-02-28 DIAGNOSIS — J3089 Other allergic rhinitis: Secondary | ICD-10-CM | POA: Diagnosis not present

## 2018-03-02 DIAGNOSIS — F1020 Alcohol dependence, uncomplicated: Secondary | ICD-10-CM | POA: Diagnosis not present

## 2018-03-02 DIAGNOSIS — F3340 Major depressive disorder, recurrent, in remission, unspecified: Secondary | ICD-10-CM | POA: Diagnosis not present

## 2018-03-07 DIAGNOSIS — J301 Allergic rhinitis due to pollen: Secondary | ICD-10-CM | POA: Diagnosis not present

## 2018-03-07 DIAGNOSIS — J3081 Allergic rhinitis due to animal (cat) (dog) hair and dander: Secondary | ICD-10-CM | POA: Diagnosis not present

## 2018-03-07 DIAGNOSIS — J3089 Other allergic rhinitis: Secondary | ICD-10-CM | POA: Diagnosis not present

## 2018-03-21 DIAGNOSIS — J301 Allergic rhinitis due to pollen: Secondary | ICD-10-CM | POA: Diagnosis not present

## 2018-03-21 DIAGNOSIS — J3089 Other allergic rhinitis: Secondary | ICD-10-CM | POA: Diagnosis not present

## 2018-03-21 DIAGNOSIS — J3081 Allergic rhinitis due to animal (cat) (dog) hair and dander: Secondary | ICD-10-CM | POA: Diagnosis not present

## 2018-03-22 DIAGNOSIS — M9901 Segmental and somatic dysfunction of cervical region: Secondary | ICD-10-CM | POA: Diagnosis not present

## 2018-03-22 DIAGNOSIS — M9902 Segmental and somatic dysfunction of thoracic region: Secondary | ICD-10-CM | POA: Diagnosis not present

## 2018-03-22 DIAGNOSIS — M9903 Segmental and somatic dysfunction of lumbar region: Secondary | ICD-10-CM | POA: Diagnosis not present

## 2018-04-05 DIAGNOSIS — J3089 Other allergic rhinitis: Secondary | ICD-10-CM | POA: Diagnosis not present

## 2018-04-05 DIAGNOSIS — J301 Allergic rhinitis due to pollen: Secondary | ICD-10-CM | POA: Diagnosis not present

## 2018-04-05 DIAGNOSIS — J3081 Allergic rhinitis due to animal (cat) (dog) hair and dander: Secondary | ICD-10-CM | POA: Diagnosis not present

## 2018-04-12 DIAGNOSIS — J3081 Allergic rhinitis due to animal (cat) (dog) hair and dander: Secondary | ICD-10-CM | POA: Diagnosis not present

## 2018-04-12 DIAGNOSIS — J3089 Other allergic rhinitis: Secondary | ICD-10-CM | POA: Diagnosis not present

## 2018-04-12 DIAGNOSIS — J301 Allergic rhinitis due to pollen: Secondary | ICD-10-CM | POA: Diagnosis not present

## 2018-04-19 DIAGNOSIS — J3089 Other allergic rhinitis: Secondary | ICD-10-CM | POA: Diagnosis not present

## 2018-04-19 DIAGNOSIS — J301 Allergic rhinitis due to pollen: Secondary | ICD-10-CM | POA: Diagnosis not present

## 2018-04-19 DIAGNOSIS — J3081 Allergic rhinitis due to animal (cat) (dog) hair and dander: Secondary | ICD-10-CM | POA: Diagnosis not present

## 2018-04-26 DIAGNOSIS — J3081 Allergic rhinitis due to animal (cat) (dog) hair and dander: Secondary | ICD-10-CM | POA: Diagnosis not present

## 2018-04-26 DIAGNOSIS — J301 Allergic rhinitis due to pollen: Secondary | ICD-10-CM | POA: Diagnosis not present

## 2018-04-26 DIAGNOSIS — J3089 Other allergic rhinitis: Secondary | ICD-10-CM | POA: Diagnosis not present

## 2018-05-03 DIAGNOSIS — J3081 Allergic rhinitis due to animal (cat) (dog) hair and dander: Secondary | ICD-10-CM | POA: Diagnosis not present

## 2018-05-03 DIAGNOSIS — J3089 Other allergic rhinitis: Secondary | ICD-10-CM | POA: Diagnosis not present

## 2018-05-03 DIAGNOSIS — J301 Allergic rhinitis due to pollen: Secondary | ICD-10-CM | POA: Diagnosis not present

## 2018-05-10 DIAGNOSIS — J3081 Allergic rhinitis due to animal (cat) (dog) hair and dander: Secondary | ICD-10-CM | POA: Diagnosis not present

## 2018-05-10 DIAGNOSIS — J301 Allergic rhinitis due to pollen: Secondary | ICD-10-CM | POA: Diagnosis not present

## 2018-05-10 DIAGNOSIS — J3089 Other allergic rhinitis: Secondary | ICD-10-CM | POA: Diagnosis not present

## 2018-05-17 DIAGNOSIS — J301 Allergic rhinitis due to pollen: Secondary | ICD-10-CM | POA: Diagnosis not present

## 2018-05-17 DIAGNOSIS — J3081 Allergic rhinitis due to animal (cat) (dog) hair and dander: Secondary | ICD-10-CM | POA: Diagnosis not present

## 2018-05-17 DIAGNOSIS — J3089 Other allergic rhinitis: Secondary | ICD-10-CM | POA: Diagnosis not present

## 2018-05-24 DIAGNOSIS — J301 Allergic rhinitis due to pollen: Secondary | ICD-10-CM | POA: Diagnosis not present

## 2018-05-24 DIAGNOSIS — J3089 Other allergic rhinitis: Secondary | ICD-10-CM | POA: Diagnosis not present

## 2018-05-24 DIAGNOSIS — J3081 Allergic rhinitis due to animal (cat) (dog) hair and dander: Secondary | ICD-10-CM | POA: Diagnosis not present

## 2018-05-28 DIAGNOSIS — F1020 Alcohol dependence, uncomplicated: Secondary | ICD-10-CM | POA: Diagnosis not present

## 2018-05-28 DIAGNOSIS — F3340 Major depressive disorder, recurrent, in remission, unspecified: Secondary | ICD-10-CM | POA: Diagnosis not present

## 2018-05-31 DIAGNOSIS — F3340 Major depressive disorder, recurrent, in remission, unspecified: Secondary | ICD-10-CM | POA: Diagnosis not present

## 2018-05-31 DIAGNOSIS — F1020 Alcohol dependence, uncomplicated: Secondary | ICD-10-CM | POA: Diagnosis not present

## 2018-06-08 DIAGNOSIS — J3089 Other allergic rhinitis: Secondary | ICD-10-CM | POA: Diagnosis not present

## 2018-06-08 DIAGNOSIS — J3081 Allergic rhinitis due to animal (cat) (dog) hair and dander: Secondary | ICD-10-CM | POA: Diagnosis not present

## 2018-06-08 DIAGNOSIS — J301 Allergic rhinitis due to pollen: Secondary | ICD-10-CM | POA: Diagnosis not present

## 2018-06-12 DIAGNOSIS — J3081 Allergic rhinitis due to animal (cat) (dog) hair and dander: Secondary | ICD-10-CM | POA: Diagnosis not present

## 2018-06-12 DIAGNOSIS — J301 Allergic rhinitis due to pollen: Secondary | ICD-10-CM | POA: Diagnosis not present

## 2018-06-13 DIAGNOSIS — J3089 Other allergic rhinitis: Secondary | ICD-10-CM | POA: Diagnosis not present

## 2018-06-14 DIAGNOSIS — J3089 Other allergic rhinitis: Secondary | ICD-10-CM | POA: Diagnosis not present

## 2018-06-14 DIAGNOSIS — J3081 Allergic rhinitis due to animal (cat) (dog) hair and dander: Secondary | ICD-10-CM | POA: Diagnosis not present

## 2018-06-14 DIAGNOSIS — J301 Allergic rhinitis due to pollen: Secondary | ICD-10-CM | POA: Diagnosis not present

## 2018-06-21 DIAGNOSIS — J3089 Other allergic rhinitis: Secondary | ICD-10-CM | POA: Diagnosis not present

## 2018-06-21 DIAGNOSIS — J3081 Allergic rhinitis due to animal (cat) (dog) hair and dander: Secondary | ICD-10-CM | POA: Diagnosis not present

## 2018-06-21 DIAGNOSIS — J301 Allergic rhinitis due to pollen: Secondary | ICD-10-CM | POA: Diagnosis not present

## 2018-06-25 DIAGNOSIS — F3340 Major depressive disorder, recurrent, in remission, unspecified: Secondary | ICD-10-CM | POA: Diagnosis not present

## 2018-06-25 DIAGNOSIS — F1020 Alcohol dependence, uncomplicated: Secondary | ICD-10-CM | POA: Diagnosis not present

## 2018-07-05 DIAGNOSIS — J3089 Other allergic rhinitis: Secondary | ICD-10-CM | POA: Diagnosis not present

## 2018-07-05 DIAGNOSIS — F3340 Major depressive disorder, recurrent, in remission, unspecified: Secondary | ICD-10-CM | POA: Diagnosis not present

## 2018-07-05 DIAGNOSIS — J301 Allergic rhinitis due to pollen: Secondary | ICD-10-CM | POA: Diagnosis not present

## 2018-07-05 DIAGNOSIS — F1020 Alcohol dependence, uncomplicated: Secondary | ICD-10-CM | POA: Diagnosis not present

## 2018-07-05 DIAGNOSIS — J3081 Allergic rhinitis due to animal (cat) (dog) hair and dander: Secondary | ICD-10-CM | POA: Diagnosis not present

## 2018-07-13 DIAGNOSIS — J301 Allergic rhinitis due to pollen: Secondary | ICD-10-CM | POA: Diagnosis not present

## 2018-07-13 DIAGNOSIS — J3081 Allergic rhinitis due to animal (cat) (dog) hair and dander: Secondary | ICD-10-CM | POA: Diagnosis not present

## 2018-07-13 DIAGNOSIS — J3089 Other allergic rhinitis: Secondary | ICD-10-CM | POA: Diagnosis not present

## 2018-07-19 DIAGNOSIS — F3340 Major depressive disorder, recurrent, in remission, unspecified: Secondary | ICD-10-CM | POA: Diagnosis not present

## 2018-07-19 DIAGNOSIS — F1020 Alcohol dependence, uncomplicated: Secondary | ICD-10-CM | POA: Diagnosis not present

## 2018-07-19 DIAGNOSIS — J3081 Allergic rhinitis due to animal (cat) (dog) hair and dander: Secondary | ICD-10-CM | POA: Diagnosis not present

## 2018-07-19 DIAGNOSIS — J301 Allergic rhinitis due to pollen: Secondary | ICD-10-CM | POA: Diagnosis not present

## 2018-07-19 DIAGNOSIS — J3089 Other allergic rhinitis: Secondary | ICD-10-CM | POA: Diagnosis not present

## 2018-07-23 DIAGNOSIS — F3340 Major depressive disorder, recurrent, in remission, unspecified: Secondary | ICD-10-CM | POA: Diagnosis not present

## 2018-07-23 DIAGNOSIS — F1020 Alcohol dependence, uncomplicated: Secondary | ICD-10-CM | POA: Diagnosis not present

## 2018-07-27 DIAGNOSIS — J301 Allergic rhinitis due to pollen: Secondary | ICD-10-CM | POA: Diagnosis not present

## 2018-07-27 DIAGNOSIS — J3081 Allergic rhinitis due to animal (cat) (dog) hair and dander: Secondary | ICD-10-CM | POA: Diagnosis not present

## 2018-07-27 DIAGNOSIS — J3089 Other allergic rhinitis: Secondary | ICD-10-CM | POA: Diagnosis not present

## 2018-08-02 DIAGNOSIS — J3081 Allergic rhinitis due to animal (cat) (dog) hair and dander: Secondary | ICD-10-CM | POA: Diagnosis not present

## 2018-08-02 DIAGNOSIS — F1020 Alcohol dependence, uncomplicated: Secondary | ICD-10-CM | POA: Diagnosis not present

## 2018-08-02 DIAGNOSIS — F3340 Major depressive disorder, recurrent, in remission, unspecified: Secondary | ICD-10-CM | POA: Diagnosis not present

## 2018-08-02 DIAGNOSIS — J301 Allergic rhinitis due to pollen: Secondary | ICD-10-CM | POA: Diagnosis not present

## 2018-08-02 DIAGNOSIS — J3089 Other allergic rhinitis: Secondary | ICD-10-CM | POA: Diagnosis not present

## 2018-08-09 DIAGNOSIS — J3089 Other allergic rhinitis: Secondary | ICD-10-CM | POA: Diagnosis not present

## 2018-08-09 DIAGNOSIS — J301 Allergic rhinitis due to pollen: Secondary | ICD-10-CM | POA: Diagnosis not present

## 2018-08-09 DIAGNOSIS — J3081 Allergic rhinitis due to animal (cat) (dog) hair and dander: Secondary | ICD-10-CM | POA: Diagnosis not present

## 2018-08-14 DIAGNOSIS — Z03818 Encounter for observation for suspected exposure to other biological agents ruled out: Secondary | ICD-10-CM | POA: Diagnosis not present

## 2018-08-16 DIAGNOSIS — F3340 Major depressive disorder, recurrent, in remission, unspecified: Secondary | ICD-10-CM | POA: Diagnosis not present

## 2018-08-16 DIAGNOSIS — F1020 Alcohol dependence, uncomplicated: Secondary | ICD-10-CM | POA: Diagnosis not present

## 2018-08-27 DIAGNOSIS — J3081 Allergic rhinitis due to animal (cat) (dog) hair and dander: Secondary | ICD-10-CM | POA: Diagnosis not present

## 2018-08-27 DIAGNOSIS — J3089 Other allergic rhinitis: Secondary | ICD-10-CM | POA: Diagnosis not present

## 2018-08-27 DIAGNOSIS — J301 Allergic rhinitis due to pollen: Secondary | ICD-10-CM | POA: Diagnosis not present

## 2018-08-30 DIAGNOSIS — F1020 Alcohol dependence, uncomplicated: Secondary | ICD-10-CM | POA: Diagnosis not present

## 2018-08-30 DIAGNOSIS — F3340 Major depressive disorder, recurrent, in remission, unspecified: Secondary | ICD-10-CM | POA: Diagnosis not present

## 2018-09-10 DIAGNOSIS — F1020 Alcohol dependence, uncomplicated: Secondary | ICD-10-CM | POA: Diagnosis not present

## 2018-09-10 DIAGNOSIS — F3340 Major depressive disorder, recurrent, in remission, unspecified: Secondary | ICD-10-CM | POA: Diagnosis not present

## 2018-09-13 DIAGNOSIS — F3340 Major depressive disorder, recurrent, in remission, unspecified: Secondary | ICD-10-CM | POA: Diagnosis not present

## 2018-09-13 DIAGNOSIS — F1020 Alcohol dependence, uncomplicated: Secondary | ICD-10-CM | POA: Diagnosis not present

## 2018-09-13 DIAGNOSIS — J3089 Other allergic rhinitis: Secondary | ICD-10-CM | POA: Diagnosis not present

## 2018-09-13 DIAGNOSIS — J301 Allergic rhinitis due to pollen: Secondary | ICD-10-CM | POA: Diagnosis not present

## 2018-09-13 DIAGNOSIS — J3081 Allergic rhinitis due to animal (cat) (dog) hair and dander: Secondary | ICD-10-CM | POA: Diagnosis not present

## 2018-09-27 DIAGNOSIS — F1020 Alcohol dependence, uncomplicated: Secondary | ICD-10-CM | POA: Diagnosis not present

## 2018-09-27 DIAGNOSIS — F3340 Major depressive disorder, recurrent, in remission, unspecified: Secondary | ICD-10-CM | POA: Diagnosis not present

## 2018-09-28 DIAGNOSIS — J301 Allergic rhinitis due to pollen: Secondary | ICD-10-CM | POA: Diagnosis not present

## 2018-09-28 DIAGNOSIS — J3081 Allergic rhinitis due to animal (cat) (dog) hair and dander: Secondary | ICD-10-CM | POA: Diagnosis not present

## 2018-09-28 DIAGNOSIS — J3089 Other allergic rhinitis: Secondary | ICD-10-CM | POA: Diagnosis not present

## 2018-10-03 DIAGNOSIS — Z0000 Encounter for general adult medical examination without abnormal findings: Secondary | ICD-10-CM | POA: Diagnosis not present

## 2018-10-08 DIAGNOSIS — F3340 Major depressive disorder, recurrent, in remission, unspecified: Secondary | ICD-10-CM | POA: Diagnosis not present

## 2018-10-08 DIAGNOSIS — F1020 Alcohol dependence, uncomplicated: Secondary | ICD-10-CM | POA: Diagnosis not present

## 2018-10-11 DIAGNOSIS — F3340 Major depressive disorder, recurrent, in remission, unspecified: Secondary | ICD-10-CM | POA: Diagnosis not present

## 2018-10-11 DIAGNOSIS — J301 Allergic rhinitis due to pollen: Secondary | ICD-10-CM | POA: Diagnosis not present

## 2018-10-11 DIAGNOSIS — F1020 Alcohol dependence, uncomplicated: Secondary | ICD-10-CM | POA: Diagnosis not present

## 2018-10-11 DIAGNOSIS — J3081 Allergic rhinitis due to animal (cat) (dog) hair and dander: Secondary | ICD-10-CM | POA: Diagnosis not present

## 2018-10-11 DIAGNOSIS — J3089 Other allergic rhinitis: Secondary | ICD-10-CM | POA: Diagnosis not present

## 2018-10-19 DIAGNOSIS — J3081 Allergic rhinitis due to animal (cat) (dog) hair and dander: Secondary | ICD-10-CM | POA: Diagnosis not present

## 2018-10-19 DIAGNOSIS — J3089 Other allergic rhinitis: Secondary | ICD-10-CM | POA: Diagnosis not present

## 2018-10-19 DIAGNOSIS — J301 Allergic rhinitis due to pollen: Secondary | ICD-10-CM | POA: Diagnosis not present

## 2018-10-25 DIAGNOSIS — F3340 Major depressive disorder, recurrent, in remission, unspecified: Secondary | ICD-10-CM | POA: Diagnosis not present

## 2018-10-25 DIAGNOSIS — F1020 Alcohol dependence, uncomplicated: Secondary | ICD-10-CM | POA: Diagnosis not present

## 2018-10-26 DIAGNOSIS — J301 Allergic rhinitis due to pollen: Secondary | ICD-10-CM | POA: Diagnosis not present

## 2018-10-26 DIAGNOSIS — J3081 Allergic rhinitis due to animal (cat) (dog) hair and dander: Secondary | ICD-10-CM | POA: Diagnosis not present

## 2018-10-26 DIAGNOSIS — J3089 Other allergic rhinitis: Secondary | ICD-10-CM | POA: Diagnosis not present

## 2018-11-06 DIAGNOSIS — F3340 Major depressive disorder, recurrent, in remission, unspecified: Secondary | ICD-10-CM | POA: Diagnosis not present

## 2018-11-06 DIAGNOSIS — F1020 Alcohol dependence, uncomplicated: Secondary | ICD-10-CM | POA: Diagnosis not present

## 2018-11-07 DIAGNOSIS — Z0000 Encounter for general adult medical examination without abnormal findings: Secondary | ICD-10-CM | POA: Diagnosis not present

## 2018-11-07 DIAGNOSIS — Z23 Encounter for immunization: Secondary | ICD-10-CM | POA: Diagnosis not present

## 2018-11-07 DIAGNOSIS — Z13220 Encounter for screening for lipoid disorders: Secondary | ICD-10-CM | POA: Diagnosis not present

## 2018-11-08 DIAGNOSIS — F3340 Major depressive disorder, recurrent, in remission, unspecified: Secondary | ICD-10-CM | POA: Diagnosis not present

## 2018-11-08 DIAGNOSIS — F1020 Alcohol dependence, uncomplicated: Secondary | ICD-10-CM | POA: Diagnosis not present

## 2018-11-09 DIAGNOSIS — J3089 Other allergic rhinitis: Secondary | ICD-10-CM | POA: Diagnosis not present

## 2018-11-09 DIAGNOSIS — J301 Allergic rhinitis due to pollen: Secondary | ICD-10-CM | POA: Diagnosis not present

## 2018-11-09 DIAGNOSIS — J3081 Allergic rhinitis due to animal (cat) (dog) hair and dander: Secondary | ICD-10-CM | POA: Diagnosis not present

## 2018-11-16 ENCOUNTER — Other Ambulatory Visit: Payer: Self-pay

## 2018-11-16 DIAGNOSIS — J3089 Other allergic rhinitis: Secondary | ICD-10-CM | POA: Diagnosis not present

## 2018-11-16 DIAGNOSIS — J301 Allergic rhinitis due to pollen: Secondary | ICD-10-CM | POA: Diagnosis not present

## 2018-11-16 DIAGNOSIS — Z20822 Contact with and (suspected) exposure to covid-19: Secondary | ICD-10-CM

## 2018-11-16 DIAGNOSIS — J3081 Allergic rhinitis due to animal (cat) (dog) hair and dander: Secondary | ICD-10-CM | POA: Diagnosis not present

## 2018-11-18 LAB — NOVEL CORONAVIRUS, NAA: SARS-CoV-2, NAA: NOT DETECTED

## 2018-11-22 DIAGNOSIS — F3340 Major depressive disorder, recurrent, in remission, unspecified: Secondary | ICD-10-CM | POA: Diagnosis not present

## 2018-11-22 DIAGNOSIS — F1020 Alcohol dependence, uncomplicated: Secondary | ICD-10-CM | POA: Diagnosis not present

## 2018-11-23 DIAGNOSIS — J3089 Other allergic rhinitis: Secondary | ICD-10-CM | POA: Diagnosis not present

## 2018-11-23 DIAGNOSIS — J3081 Allergic rhinitis due to animal (cat) (dog) hair and dander: Secondary | ICD-10-CM | POA: Diagnosis not present

## 2018-11-23 DIAGNOSIS — J301 Allergic rhinitis due to pollen: Secondary | ICD-10-CM | POA: Diagnosis not present

## 2018-11-30 DIAGNOSIS — J3081 Allergic rhinitis due to animal (cat) (dog) hair and dander: Secondary | ICD-10-CM | POA: Diagnosis not present

## 2018-11-30 DIAGNOSIS — J301 Allergic rhinitis due to pollen: Secondary | ICD-10-CM | POA: Diagnosis not present

## 2018-11-30 DIAGNOSIS — Z20828 Contact with and (suspected) exposure to other viral communicable diseases: Secondary | ICD-10-CM | POA: Diagnosis not present

## 2018-11-30 DIAGNOSIS — J3089 Other allergic rhinitis: Secondary | ICD-10-CM | POA: Diagnosis not present

## 2018-12-06 DIAGNOSIS — F3340 Major depressive disorder, recurrent, in remission, unspecified: Secondary | ICD-10-CM | POA: Diagnosis not present

## 2018-12-06 DIAGNOSIS — F1020 Alcohol dependence, uncomplicated: Secondary | ICD-10-CM | POA: Diagnosis not present

## 2018-12-07 DIAGNOSIS — J301 Allergic rhinitis due to pollen: Secondary | ICD-10-CM | POA: Diagnosis not present

## 2018-12-07 DIAGNOSIS — J3081 Allergic rhinitis due to animal (cat) (dog) hair and dander: Secondary | ICD-10-CM | POA: Diagnosis not present

## 2018-12-07 DIAGNOSIS — J3089 Other allergic rhinitis: Secondary | ICD-10-CM | POA: Diagnosis not present

## 2018-12-12 DIAGNOSIS — J3089 Other allergic rhinitis: Secondary | ICD-10-CM | POA: Diagnosis not present

## 2018-12-12 DIAGNOSIS — J301 Allergic rhinitis due to pollen: Secondary | ICD-10-CM | POA: Diagnosis not present

## 2018-12-12 DIAGNOSIS — J3081 Allergic rhinitis due to animal (cat) (dog) hair and dander: Secondary | ICD-10-CM | POA: Diagnosis not present

## 2018-12-18 DIAGNOSIS — F1020 Alcohol dependence, uncomplicated: Secondary | ICD-10-CM | POA: Diagnosis not present

## 2018-12-18 DIAGNOSIS — F3340 Major depressive disorder, recurrent, in remission, unspecified: Secondary | ICD-10-CM | POA: Diagnosis not present

## 2018-12-21 DIAGNOSIS — J3089 Other allergic rhinitis: Secondary | ICD-10-CM | POA: Diagnosis not present

## 2018-12-21 DIAGNOSIS — J3081 Allergic rhinitis due to animal (cat) (dog) hair and dander: Secondary | ICD-10-CM | POA: Diagnosis not present

## 2018-12-21 DIAGNOSIS — J301 Allergic rhinitis due to pollen: Secondary | ICD-10-CM | POA: Diagnosis not present

## 2019-01-02 DIAGNOSIS — J301 Allergic rhinitis due to pollen: Secondary | ICD-10-CM | POA: Diagnosis not present

## 2019-01-02 DIAGNOSIS — J3081 Allergic rhinitis due to animal (cat) (dog) hair and dander: Secondary | ICD-10-CM | POA: Diagnosis not present

## 2019-01-03 DIAGNOSIS — F3340 Major depressive disorder, recurrent, in remission, unspecified: Secondary | ICD-10-CM | POA: Diagnosis not present

## 2019-01-03 DIAGNOSIS — J3089 Other allergic rhinitis: Secondary | ICD-10-CM | POA: Diagnosis not present

## 2019-01-03 DIAGNOSIS — F1020 Alcohol dependence, uncomplicated: Secondary | ICD-10-CM | POA: Diagnosis not present

## 2019-01-04 DIAGNOSIS — J3089 Other allergic rhinitis: Secondary | ICD-10-CM | POA: Diagnosis not present

## 2019-01-04 DIAGNOSIS — J3081 Allergic rhinitis due to animal (cat) (dog) hair and dander: Secondary | ICD-10-CM | POA: Diagnosis not present

## 2019-01-04 DIAGNOSIS — J301 Allergic rhinitis due to pollen: Secondary | ICD-10-CM | POA: Diagnosis not present

## 2019-01-08 ENCOUNTER — Other Ambulatory Visit: Payer: Self-pay | Admitting: Obstetrics and Gynecology

## 2019-01-08 DIAGNOSIS — N6489 Other specified disorders of breast: Secondary | ICD-10-CM

## 2019-01-09 DIAGNOSIS — J3089 Other allergic rhinitis: Secondary | ICD-10-CM | POA: Diagnosis not present

## 2019-01-09 DIAGNOSIS — J3081 Allergic rhinitis due to animal (cat) (dog) hair and dander: Secondary | ICD-10-CM | POA: Diagnosis not present

## 2019-01-09 DIAGNOSIS — J301 Allergic rhinitis due to pollen: Secondary | ICD-10-CM | POA: Diagnosis not present

## 2019-01-16 DIAGNOSIS — J3089 Other allergic rhinitis: Secondary | ICD-10-CM | POA: Diagnosis not present

## 2019-01-16 DIAGNOSIS — F3340 Major depressive disorder, recurrent, in remission, unspecified: Secondary | ICD-10-CM | POA: Diagnosis not present

## 2019-01-16 DIAGNOSIS — J3081 Allergic rhinitis due to animal (cat) (dog) hair and dander: Secondary | ICD-10-CM | POA: Diagnosis not present

## 2019-01-16 DIAGNOSIS — F1020 Alcohol dependence, uncomplicated: Secondary | ICD-10-CM | POA: Diagnosis not present

## 2019-01-16 DIAGNOSIS — J301 Allergic rhinitis due to pollen: Secondary | ICD-10-CM | POA: Diagnosis not present

## 2019-01-29 DIAGNOSIS — J301 Allergic rhinitis due to pollen: Secondary | ICD-10-CM | POA: Diagnosis not present

## 2019-01-29 DIAGNOSIS — J3081 Allergic rhinitis due to animal (cat) (dog) hair and dander: Secondary | ICD-10-CM | POA: Diagnosis not present

## 2019-01-29 DIAGNOSIS — L700 Acne vulgaris: Secondary | ICD-10-CM | POA: Diagnosis not present

## 2019-01-29 DIAGNOSIS — F3340 Major depressive disorder, recurrent, in remission, unspecified: Secondary | ICD-10-CM | POA: Diagnosis not present

## 2019-01-29 DIAGNOSIS — F1020 Alcohol dependence, uncomplicated: Secondary | ICD-10-CM | POA: Diagnosis not present

## 2019-01-29 DIAGNOSIS — L905 Scar conditions and fibrosis of skin: Secondary | ICD-10-CM | POA: Diagnosis not present

## 2019-01-29 DIAGNOSIS — L814 Other melanin hyperpigmentation: Secondary | ICD-10-CM | POA: Diagnosis not present

## 2019-01-29 DIAGNOSIS — J3089 Other allergic rhinitis: Secondary | ICD-10-CM | POA: Diagnosis not present

## 2019-01-29 DIAGNOSIS — D225 Melanocytic nevi of trunk: Secondary | ICD-10-CM | POA: Diagnosis not present

## 2019-01-31 DIAGNOSIS — F3340 Major depressive disorder, recurrent, in remission, unspecified: Secondary | ICD-10-CM | POA: Diagnosis not present

## 2019-01-31 DIAGNOSIS — F1020 Alcohol dependence, uncomplicated: Secondary | ICD-10-CM | POA: Diagnosis not present

## 2019-02-11 ENCOUNTER — Other Ambulatory Visit: Payer: Self-pay | Admitting: Obstetrics and Gynecology

## 2019-02-11 DIAGNOSIS — J3081 Allergic rhinitis due to animal (cat) (dog) hair and dander: Secondary | ICD-10-CM | POA: Diagnosis not present

## 2019-02-11 DIAGNOSIS — J301 Allergic rhinitis due to pollen: Secondary | ICD-10-CM | POA: Diagnosis not present

## 2019-02-11 DIAGNOSIS — J3089 Other allergic rhinitis: Secondary | ICD-10-CM | POA: Diagnosis not present

## 2019-02-11 DIAGNOSIS — J4540 Moderate persistent asthma, uncomplicated: Secondary | ICD-10-CM | POA: Diagnosis not present

## 2019-02-12 ENCOUNTER — Other Ambulatory Visit: Payer: Self-pay

## 2019-02-12 ENCOUNTER — Ambulatory Visit
Admission: RE | Admit: 2019-02-12 | Discharge: 2019-02-12 | Disposition: A | Discharge status: Home / Self Care | Payer: BC Managed Care – PPO | Source: Ambulatory Visit | Attending: Obstetrics and Gynecology | Admitting: Obstetrics and Gynecology

## 2019-02-12 DIAGNOSIS — N6489 Other specified disorders of breast: Secondary | ICD-10-CM

## 2019-02-12 DIAGNOSIS — R922 Inconclusive mammogram: Secondary | ICD-10-CM | POA: Diagnosis not present

## 2019-02-13 ENCOUNTER — Other Ambulatory Visit: Payer: Self-pay | Admitting: Family Medicine

## 2019-02-14 DIAGNOSIS — F1020 Alcohol dependence, uncomplicated: Secondary | ICD-10-CM | POA: Diagnosis not present

## 2019-02-14 DIAGNOSIS — F3340 Major depressive disorder, recurrent, in remission, unspecified: Secondary | ICD-10-CM | POA: Diagnosis not present

## 2019-02-22 DIAGNOSIS — J3089 Other allergic rhinitis: Secondary | ICD-10-CM | POA: Diagnosis not present

## 2019-02-22 DIAGNOSIS — J3081 Allergic rhinitis due to animal (cat) (dog) hair and dander: Secondary | ICD-10-CM | POA: Diagnosis not present

## 2019-02-22 DIAGNOSIS — J301 Allergic rhinitis due to pollen: Secondary | ICD-10-CM | POA: Diagnosis not present

## 2019-02-28 DIAGNOSIS — F1020 Alcohol dependence, uncomplicated: Secondary | ICD-10-CM | POA: Diagnosis not present

## 2019-02-28 DIAGNOSIS — F3340 Major depressive disorder, recurrent, in remission, unspecified: Secondary | ICD-10-CM | POA: Diagnosis not present

## 2019-03-01 DIAGNOSIS — J3089 Other allergic rhinitis: Secondary | ICD-10-CM | POA: Diagnosis not present

## 2019-03-01 DIAGNOSIS — J301 Allergic rhinitis due to pollen: Secondary | ICD-10-CM | POA: Diagnosis not present

## 2019-03-01 DIAGNOSIS — J3081 Allergic rhinitis due to animal (cat) (dog) hair and dander: Secondary | ICD-10-CM | POA: Diagnosis not present

## 2019-03-12 DIAGNOSIS — J3089 Other allergic rhinitis: Secondary | ICD-10-CM | POA: Diagnosis not present

## 2019-03-12 DIAGNOSIS — J3081 Allergic rhinitis due to animal (cat) (dog) hair and dander: Secondary | ICD-10-CM | POA: Diagnosis not present

## 2019-03-12 DIAGNOSIS — J301 Allergic rhinitis due to pollen: Secondary | ICD-10-CM | POA: Diagnosis not present

## 2019-03-14 DIAGNOSIS — F1020 Alcohol dependence, uncomplicated: Secondary | ICD-10-CM | POA: Diagnosis not present

## 2019-03-14 DIAGNOSIS — F3340 Major depressive disorder, recurrent, in remission, unspecified: Secondary | ICD-10-CM | POA: Diagnosis not present

## 2019-03-15 DIAGNOSIS — J301 Allergic rhinitis due to pollen: Secondary | ICD-10-CM | POA: Diagnosis not present

## 2019-03-15 DIAGNOSIS — J3089 Other allergic rhinitis: Secondary | ICD-10-CM | POA: Diagnosis not present

## 2019-03-15 DIAGNOSIS — J3081 Allergic rhinitis due to animal (cat) (dog) hair and dander: Secondary | ICD-10-CM | POA: Diagnosis not present

## 2019-03-20 DIAGNOSIS — J3089 Other allergic rhinitis: Secondary | ICD-10-CM | POA: Diagnosis not present

## 2019-03-20 DIAGNOSIS — J301 Allergic rhinitis due to pollen: Secondary | ICD-10-CM | POA: Diagnosis not present

## 2019-03-20 DIAGNOSIS — J3081 Allergic rhinitis due to animal (cat) (dog) hair and dander: Secondary | ICD-10-CM | POA: Diagnosis not present

## 2019-03-25 DIAGNOSIS — J301 Allergic rhinitis due to pollen: Secondary | ICD-10-CM | POA: Diagnosis not present

## 2019-03-25 DIAGNOSIS — J3081 Allergic rhinitis due to animal (cat) (dog) hair and dander: Secondary | ICD-10-CM | POA: Diagnosis not present

## 2019-03-25 DIAGNOSIS — J3089 Other allergic rhinitis: Secondary | ICD-10-CM | POA: Diagnosis not present

## 2019-03-27 DIAGNOSIS — Z23 Encounter for immunization: Secondary | ICD-10-CM | POA: Diagnosis not present

## 2019-03-28 DIAGNOSIS — F1020 Alcohol dependence, uncomplicated: Secondary | ICD-10-CM | POA: Diagnosis not present

## 2019-03-28 DIAGNOSIS — F3340 Major depressive disorder, recurrent, in remission, unspecified: Secondary | ICD-10-CM | POA: Diagnosis not present

## 2019-04-04 DIAGNOSIS — J3089 Other allergic rhinitis: Secondary | ICD-10-CM | POA: Diagnosis not present

## 2019-04-04 DIAGNOSIS — J3081 Allergic rhinitis due to animal (cat) (dog) hair and dander: Secondary | ICD-10-CM | POA: Diagnosis not present

## 2019-04-04 DIAGNOSIS — J301 Allergic rhinitis due to pollen: Secondary | ICD-10-CM | POA: Diagnosis not present

## 2019-04-10 DIAGNOSIS — Z1151 Encounter for screening for human papillomavirus (HPV): Secondary | ICD-10-CM | POA: Diagnosis not present

## 2019-04-10 DIAGNOSIS — Z01419 Encounter for gynecological examination (general) (routine) without abnormal findings: Secondary | ICD-10-CM | POA: Diagnosis not present

## 2019-04-10 DIAGNOSIS — Z6823 Body mass index (BMI) 23.0-23.9, adult: Secondary | ICD-10-CM | POA: Diagnosis not present

## 2019-04-11 DIAGNOSIS — F339 Major depressive disorder, recurrent, unspecified: Secondary | ICD-10-CM | POA: Diagnosis not present

## 2019-04-12 DIAGNOSIS — J3081 Allergic rhinitis due to animal (cat) (dog) hair and dander: Secondary | ICD-10-CM | POA: Diagnosis not present

## 2019-04-12 DIAGNOSIS — J301 Allergic rhinitis due to pollen: Secondary | ICD-10-CM | POA: Diagnosis not present

## 2019-04-12 DIAGNOSIS — J3089 Other allergic rhinitis: Secondary | ICD-10-CM | POA: Diagnosis not present

## 2019-04-16 DIAGNOSIS — F411 Generalized anxiety disorder: Secondary | ICD-10-CM | POA: Diagnosis not present

## 2019-04-16 DIAGNOSIS — J301 Allergic rhinitis due to pollen: Secondary | ICD-10-CM | POA: Diagnosis not present

## 2019-04-16 DIAGNOSIS — J3081 Allergic rhinitis due to animal (cat) (dog) hair and dander: Secondary | ICD-10-CM | POA: Diagnosis not present

## 2019-04-16 DIAGNOSIS — F331 Major depressive disorder, recurrent, moderate: Secondary | ICD-10-CM | POA: Diagnosis not present

## 2019-04-16 DIAGNOSIS — J3089 Other allergic rhinitis: Secondary | ICD-10-CM | POA: Diagnosis not present

## 2019-04-17 DIAGNOSIS — Z23 Encounter for immunization: Secondary | ICD-10-CM | POA: Diagnosis not present

## 2019-04-25 DIAGNOSIS — J3081 Allergic rhinitis due to animal (cat) (dog) hair and dander: Secondary | ICD-10-CM | POA: Diagnosis not present

## 2019-04-25 DIAGNOSIS — F339 Major depressive disorder, recurrent, unspecified: Secondary | ICD-10-CM | POA: Diagnosis not present

## 2019-04-25 DIAGNOSIS — J3089 Other allergic rhinitis: Secondary | ICD-10-CM | POA: Diagnosis not present

## 2019-04-25 DIAGNOSIS — J301 Allergic rhinitis due to pollen: Secondary | ICD-10-CM | POA: Diagnosis not present

## 2019-05-02 DIAGNOSIS — J301 Allergic rhinitis due to pollen: Secondary | ICD-10-CM | POA: Diagnosis not present

## 2019-05-02 DIAGNOSIS — J3089 Other allergic rhinitis: Secondary | ICD-10-CM | POA: Diagnosis not present

## 2019-05-02 DIAGNOSIS — J3081 Allergic rhinitis due to animal (cat) (dog) hair and dander: Secondary | ICD-10-CM | POA: Diagnosis not present

## 2019-05-09 DIAGNOSIS — J301 Allergic rhinitis due to pollen: Secondary | ICD-10-CM | POA: Diagnosis not present

## 2019-05-09 DIAGNOSIS — J3081 Allergic rhinitis due to animal (cat) (dog) hair and dander: Secondary | ICD-10-CM | POA: Diagnosis not present

## 2019-05-09 DIAGNOSIS — J3089 Other allergic rhinitis: Secondary | ICD-10-CM | POA: Diagnosis not present

## 2019-05-09 DIAGNOSIS — F411 Generalized anxiety disorder: Secondary | ICD-10-CM | POA: Diagnosis not present

## 2019-05-16 DIAGNOSIS — W57XXXA Bitten or stung by nonvenomous insect and other nonvenomous arthropods, initial encounter: Secondary | ICD-10-CM | POA: Diagnosis not present

## 2019-05-16 DIAGNOSIS — S70369A Insect bite (nonvenomous), unspecified thigh, initial encounter: Secondary | ICD-10-CM | POA: Diagnosis not present

## 2019-05-21 DIAGNOSIS — J3089 Other allergic rhinitis: Secondary | ICD-10-CM | POA: Diagnosis not present

## 2019-05-21 DIAGNOSIS — J3081 Allergic rhinitis due to animal (cat) (dog) hair and dander: Secondary | ICD-10-CM | POA: Diagnosis not present

## 2019-05-21 DIAGNOSIS — J301 Allergic rhinitis due to pollen: Secondary | ICD-10-CM | POA: Diagnosis not present

## 2019-05-23 DIAGNOSIS — F339 Major depressive disorder, recurrent, unspecified: Secondary | ICD-10-CM | POA: Diagnosis not present

## 2019-05-23 DIAGNOSIS — F411 Generalized anxiety disorder: Secondary | ICD-10-CM | POA: Diagnosis not present

## 2019-05-30 DIAGNOSIS — J301 Allergic rhinitis due to pollen: Secondary | ICD-10-CM | POA: Diagnosis not present

## 2019-05-30 DIAGNOSIS — J3089 Other allergic rhinitis: Secondary | ICD-10-CM | POA: Diagnosis not present

## 2019-05-30 DIAGNOSIS — J3081 Allergic rhinitis due to animal (cat) (dog) hair and dander: Secondary | ICD-10-CM | POA: Diagnosis not present

## 2019-06-06 DIAGNOSIS — J301 Allergic rhinitis due to pollen: Secondary | ICD-10-CM | POA: Diagnosis not present

## 2019-06-06 DIAGNOSIS — J3081 Allergic rhinitis due to animal (cat) (dog) hair and dander: Secondary | ICD-10-CM | POA: Diagnosis not present

## 2019-06-06 DIAGNOSIS — F339 Major depressive disorder, recurrent, unspecified: Secondary | ICD-10-CM | POA: Diagnosis not present

## 2019-06-06 DIAGNOSIS — F411 Generalized anxiety disorder: Secondary | ICD-10-CM | POA: Diagnosis not present

## 2019-06-06 DIAGNOSIS — J3089 Other allergic rhinitis: Secondary | ICD-10-CM | POA: Diagnosis not present

## 2019-06-06 DIAGNOSIS — F410 Panic disorder [episodic paroxysmal anxiety] without agoraphobia: Secondary | ICD-10-CM | POA: Diagnosis not present

## 2019-06-22 DIAGNOSIS — Z20822 Contact with and (suspected) exposure to covid-19: Secondary | ICD-10-CM | POA: Diagnosis not present

## 2019-06-22 DIAGNOSIS — J209 Acute bronchitis, unspecified: Secondary | ICD-10-CM | POA: Diagnosis not present

## 2019-07-05 DIAGNOSIS — J3089 Other allergic rhinitis: Secondary | ICD-10-CM | POA: Diagnosis not present

## 2019-07-05 DIAGNOSIS — J3081 Allergic rhinitis due to animal (cat) (dog) hair and dander: Secondary | ICD-10-CM | POA: Diagnosis not present

## 2019-07-05 DIAGNOSIS — J301 Allergic rhinitis due to pollen: Secondary | ICD-10-CM | POA: Diagnosis not present

## 2019-07-11 DIAGNOSIS — F4312 Post-traumatic stress disorder, chronic: Secondary | ICD-10-CM | POA: Diagnosis not present

## 2019-07-11 DIAGNOSIS — F4011 Social phobia, generalized: Secondary | ICD-10-CM | POA: Diagnosis not present

## 2019-07-11 DIAGNOSIS — F902 Attention-deficit hyperactivity disorder, combined type: Secondary | ICD-10-CM | POA: Diagnosis not present

## 2019-07-11 DIAGNOSIS — F339 Major depressive disorder, recurrent, unspecified: Secondary | ICD-10-CM | POA: Diagnosis not present

## 2019-07-18 DIAGNOSIS — F331 Major depressive disorder, recurrent, moderate: Secondary | ICD-10-CM | POA: Diagnosis not present

## 2019-07-18 DIAGNOSIS — F411 Generalized anxiety disorder: Secondary | ICD-10-CM | POA: Diagnosis not present

## 2019-07-30 DIAGNOSIS — J3081 Allergic rhinitis due to animal (cat) (dog) hair and dander: Secondary | ICD-10-CM | POA: Diagnosis not present

## 2019-07-30 DIAGNOSIS — J301 Allergic rhinitis due to pollen: Secondary | ICD-10-CM | POA: Diagnosis not present

## 2019-07-30 DIAGNOSIS — J3089 Other allergic rhinitis: Secondary | ICD-10-CM | POA: Diagnosis not present

## 2019-08-01 DIAGNOSIS — F411 Generalized anxiety disorder: Secondary | ICD-10-CM | POA: Diagnosis not present

## 2019-08-01 DIAGNOSIS — F339 Major depressive disorder, recurrent, unspecified: Secondary | ICD-10-CM | POA: Diagnosis not present

## 2019-08-09 DIAGNOSIS — J301 Allergic rhinitis due to pollen: Secondary | ICD-10-CM | POA: Diagnosis not present

## 2019-08-09 DIAGNOSIS — J3081 Allergic rhinitis due to animal (cat) (dog) hair and dander: Secondary | ICD-10-CM | POA: Diagnosis not present

## 2019-08-09 DIAGNOSIS — J3089 Other allergic rhinitis: Secondary | ICD-10-CM | POA: Diagnosis not present

## 2019-08-13 DIAGNOSIS — J3081 Allergic rhinitis due to animal (cat) (dog) hair and dander: Secondary | ICD-10-CM | POA: Diagnosis not present

## 2019-08-13 DIAGNOSIS — J301 Allergic rhinitis due to pollen: Secondary | ICD-10-CM | POA: Diagnosis not present

## 2019-08-13 DIAGNOSIS — J3089 Other allergic rhinitis: Secondary | ICD-10-CM | POA: Diagnosis not present

## 2019-09-04 DIAGNOSIS — J3089 Other allergic rhinitis: Secondary | ICD-10-CM | POA: Diagnosis not present

## 2019-09-04 DIAGNOSIS — J3081 Allergic rhinitis due to animal (cat) (dog) hair and dander: Secondary | ICD-10-CM | POA: Diagnosis not present

## 2019-09-04 DIAGNOSIS — J301 Allergic rhinitis due to pollen: Secondary | ICD-10-CM | POA: Diagnosis not present

## 2019-09-11 DIAGNOSIS — J3081 Allergic rhinitis due to animal (cat) (dog) hair and dander: Secondary | ICD-10-CM | POA: Diagnosis not present

## 2019-09-11 DIAGNOSIS — J3089 Other allergic rhinitis: Secondary | ICD-10-CM | POA: Diagnosis not present

## 2019-09-11 DIAGNOSIS — J301 Allergic rhinitis due to pollen: Secondary | ICD-10-CM | POA: Diagnosis not present

## 2019-09-13 DIAGNOSIS — J301 Allergic rhinitis due to pollen: Secondary | ICD-10-CM | POA: Diagnosis not present

## 2019-09-13 DIAGNOSIS — J3081 Allergic rhinitis due to animal (cat) (dog) hair and dander: Secondary | ICD-10-CM | POA: Diagnosis not present

## 2019-09-16 DIAGNOSIS — J3089 Other allergic rhinitis: Secondary | ICD-10-CM | POA: Diagnosis not present

## 2019-09-18 DIAGNOSIS — J3089 Other allergic rhinitis: Secondary | ICD-10-CM | POA: Diagnosis not present

## 2019-09-18 DIAGNOSIS — J3081 Allergic rhinitis due to animal (cat) (dog) hair and dander: Secondary | ICD-10-CM | POA: Diagnosis not present

## 2019-09-18 DIAGNOSIS — J301 Allergic rhinitis due to pollen: Secondary | ICD-10-CM | POA: Diagnosis not present

## 2019-09-25 DIAGNOSIS — J3081 Allergic rhinitis due to animal (cat) (dog) hair and dander: Secondary | ICD-10-CM | POA: Diagnosis not present

## 2019-09-25 DIAGNOSIS — J3089 Other allergic rhinitis: Secondary | ICD-10-CM | POA: Diagnosis not present

## 2019-09-25 DIAGNOSIS — J301 Allergic rhinitis due to pollen: Secondary | ICD-10-CM | POA: Diagnosis not present

## 2019-09-25 DIAGNOSIS — F339 Major depressive disorder, recurrent, unspecified: Secondary | ICD-10-CM | POA: Diagnosis not present

## 2019-09-25 DIAGNOSIS — F411 Generalized anxiety disorder: Secondary | ICD-10-CM | POA: Diagnosis not present

## 2019-09-30 DIAGNOSIS — F410 Panic disorder [episodic paroxysmal anxiety] without agoraphobia: Secondary | ICD-10-CM | POA: Diagnosis not present

## 2019-09-30 DIAGNOSIS — F4312 Post-traumatic stress disorder, chronic: Secondary | ICD-10-CM | POA: Diagnosis not present

## 2019-09-30 DIAGNOSIS — F331 Major depressive disorder, recurrent, moderate: Secondary | ICD-10-CM | POA: Diagnosis not present

## 2019-09-30 DIAGNOSIS — F411 Generalized anxiety disorder: Secondary | ICD-10-CM | POA: Diagnosis not present

## 2019-10-02 DIAGNOSIS — Z9109 Other allergy status, other than to drugs and biological substances: Secondary | ICD-10-CM | POA: Diagnosis not present

## 2019-10-02 DIAGNOSIS — Z0000 Encounter for general adult medical examination without abnormal findings: Secondary | ICD-10-CM | POA: Diagnosis not present

## 2019-10-02 DIAGNOSIS — Z13220 Encounter for screening for lipoid disorders: Secondary | ICD-10-CM | POA: Diagnosis not present

## 2019-10-02 DIAGNOSIS — Z23 Encounter for immunization: Secondary | ICD-10-CM | POA: Diagnosis not present

## 2019-10-02 DIAGNOSIS — J45998 Other asthma: Secondary | ICD-10-CM | POA: Diagnosis not present

## 2019-10-09 DIAGNOSIS — F902 Attention-deficit hyperactivity disorder, combined type: Secondary | ICD-10-CM | POA: Diagnosis not present

## 2019-10-09 DIAGNOSIS — F339 Major depressive disorder, recurrent, unspecified: Secondary | ICD-10-CM | POA: Diagnosis not present

## 2019-10-09 DIAGNOSIS — J3081 Allergic rhinitis due to animal (cat) (dog) hair and dander: Secondary | ICD-10-CM | POA: Diagnosis not present

## 2019-10-09 DIAGNOSIS — F411 Generalized anxiety disorder: Secondary | ICD-10-CM | POA: Diagnosis not present

## 2019-10-09 DIAGNOSIS — J301 Allergic rhinitis due to pollen: Secondary | ICD-10-CM | POA: Diagnosis not present

## 2019-10-09 DIAGNOSIS — J3089 Other allergic rhinitis: Secondary | ICD-10-CM | POA: Diagnosis not present

## 2019-10-16 DIAGNOSIS — J301 Allergic rhinitis due to pollen: Secondary | ICD-10-CM | POA: Diagnosis not present

## 2019-10-16 DIAGNOSIS — J3081 Allergic rhinitis due to animal (cat) (dog) hair and dander: Secondary | ICD-10-CM | POA: Diagnosis not present

## 2019-10-16 DIAGNOSIS — J3089 Other allergic rhinitis: Secondary | ICD-10-CM | POA: Diagnosis not present

## 2019-10-23 DIAGNOSIS — J301 Allergic rhinitis due to pollen: Secondary | ICD-10-CM | POA: Diagnosis not present

## 2019-10-23 DIAGNOSIS — F339 Major depressive disorder, recurrent, unspecified: Secondary | ICD-10-CM | POA: Diagnosis not present

## 2019-10-23 DIAGNOSIS — J3089 Other allergic rhinitis: Secondary | ICD-10-CM | POA: Diagnosis not present

## 2019-10-23 DIAGNOSIS — J3081 Allergic rhinitis due to animal (cat) (dog) hair and dander: Secondary | ICD-10-CM | POA: Diagnosis not present

## 2019-10-23 DIAGNOSIS — F411 Generalized anxiety disorder: Secondary | ICD-10-CM | POA: Diagnosis not present

## 2019-10-30 DIAGNOSIS — J3089 Other allergic rhinitis: Secondary | ICD-10-CM | POA: Diagnosis not present

## 2019-10-30 DIAGNOSIS — J301 Allergic rhinitis due to pollen: Secondary | ICD-10-CM | POA: Diagnosis not present

## 2019-10-30 DIAGNOSIS — J3081 Allergic rhinitis due to animal (cat) (dog) hair and dander: Secondary | ICD-10-CM | POA: Diagnosis not present

## 2019-11-06 DIAGNOSIS — F411 Generalized anxiety disorder: Secondary | ICD-10-CM | POA: Diagnosis not present

## 2019-11-06 DIAGNOSIS — F339 Major depressive disorder, recurrent, unspecified: Secondary | ICD-10-CM | POA: Diagnosis not present

## 2019-11-06 DIAGNOSIS — F4312 Post-traumatic stress disorder, chronic: Secondary | ICD-10-CM | POA: Diagnosis not present

## 2019-11-06 DIAGNOSIS — F902 Attention-deficit hyperactivity disorder, combined type: Secondary | ICD-10-CM | POA: Diagnosis not present

## 2019-11-06 DIAGNOSIS — J3089 Other allergic rhinitis: Secondary | ICD-10-CM | POA: Diagnosis not present

## 2019-11-06 DIAGNOSIS — J3081 Allergic rhinitis due to animal (cat) (dog) hair and dander: Secondary | ICD-10-CM | POA: Diagnosis not present

## 2019-11-06 DIAGNOSIS — J301 Allergic rhinitis due to pollen: Secondary | ICD-10-CM | POA: Diagnosis not present

## 2019-11-13 DIAGNOSIS — J3089 Other allergic rhinitis: Secondary | ICD-10-CM | POA: Diagnosis not present

## 2019-11-13 DIAGNOSIS — J301 Allergic rhinitis due to pollen: Secondary | ICD-10-CM | POA: Diagnosis not present

## 2019-11-13 DIAGNOSIS — J3081 Allergic rhinitis due to animal (cat) (dog) hair and dander: Secondary | ICD-10-CM | POA: Diagnosis not present

## 2019-11-20 DIAGNOSIS — F411 Generalized anxiety disorder: Secondary | ICD-10-CM | POA: Diagnosis not present

## 2019-11-20 DIAGNOSIS — F339 Major depressive disorder, recurrent, unspecified: Secondary | ICD-10-CM | POA: Diagnosis not present

## 2019-11-20 DIAGNOSIS — F902 Attention-deficit hyperactivity disorder, combined type: Secondary | ICD-10-CM | POA: Diagnosis not present

## 2019-11-20 DIAGNOSIS — F4312 Post-traumatic stress disorder, chronic: Secondary | ICD-10-CM | POA: Diagnosis not present

## 2019-11-21 DIAGNOSIS — J301 Allergic rhinitis due to pollen: Secondary | ICD-10-CM | POA: Diagnosis not present

## 2019-11-21 DIAGNOSIS — J3089 Other allergic rhinitis: Secondary | ICD-10-CM | POA: Diagnosis not present

## 2019-11-21 DIAGNOSIS — J3081 Allergic rhinitis due to animal (cat) (dog) hair and dander: Secondary | ICD-10-CM | POA: Diagnosis not present

## 2019-11-27 DIAGNOSIS — J3081 Allergic rhinitis due to animal (cat) (dog) hair and dander: Secondary | ICD-10-CM | POA: Diagnosis not present

## 2019-11-27 DIAGNOSIS — J3089 Other allergic rhinitis: Secondary | ICD-10-CM | POA: Diagnosis not present

## 2019-11-27 DIAGNOSIS — J301 Allergic rhinitis due to pollen: Secondary | ICD-10-CM | POA: Diagnosis not present

## 2019-12-04 DIAGNOSIS — F339 Major depressive disorder, recurrent, unspecified: Secondary | ICD-10-CM | POA: Diagnosis not present

## 2019-12-04 DIAGNOSIS — F411 Generalized anxiety disorder: Secondary | ICD-10-CM | POA: Diagnosis not present

## 2019-12-11 DIAGNOSIS — J301 Allergic rhinitis due to pollen: Secondary | ICD-10-CM | POA: Diagnosis not present

## 2019-12-11 DIAGNOSIS — J3089 Other allergic rhinitis: Secondary | ICD-10-CM | POA: Diagnosis not present

## 2019-12-11 DIAGNOSIS — J3081 Allergic rhinitis due to animal (cat) (dog) hair and dander: Secondary | ICD-10-CM | POA: Diagnosis not present

## 2019-12-18 DIAGNOSIS — J3081 Allergic rhinitis due to animal (cat) (dog) hair and dander: Secondary | ICD-10-CM | POA: Diagnosis not present

## 2019-12-18 DIAGNOSIS — T63441D Toxic effect of venom of bees, accidental (unintentional), subsequent encounter: Secondary | ICD-10-CM | POA: Diagnosis not present

## 2019-12-18 DIAGNOSIS — J3089 Other allergic rhinitis: Secondary | ICD-10-CM | POA: Diagnosis not present

## 2019-12-18 DIAGNOSIS — J301 Allergic rhinitis due to pollen: Secondary | ICD-10-CM | POA: Diagnosis not present

## 2019-12-25 DIAGNOSIS — F411 Generalized anxiety disorder: Secondary | ICD-10-CM | POA: Diagnosis not present

## 2019-12-25 DIAGNOSIS — F410 Panic disorder [episodic paroxysmal anxiety] without agoraphobia: Secondary | ICD-10-CM | POA: Diagnosis not present

## 2019-12-25 DIAGNOSIS — J301 Allergic rhinitis due to pollen: Secondary | ICD-10-CM | POA: Diagnosis not present

## 2019-12-25 DIAGNOSIS — J3089 Other allergic rhinitis: Secondary | ICD-10-CM | POA: Diagnosis not present

## 2019-12-25 DIAGNOSIS — F4312 Post-traumatic stress disorder, chronic: Secondary | ICD-10-CM | POA: Diagnosis not present

## 2019-12-25 DIAGNOSIS — F331 Major depressive disorder, recurrent, moderate: Secondary | ICD-10-CM | POA: Diagnosis not present

## 2019-12-25 DIAGNOSIS — J3081 Allergic rhinitis due to animal (cat) (dog) hair and dander: Secondary | ICD-10-CM | POA: Diagnosis not present

## 2019-12-25 DIAGNOSIS — F339 Major depressive disorder, recurrent, unspecified: Secondary | ICD-10-CM | POA: Diagnosis not present

## 2019-12-29 DIAGNOSIS — J019 Acute sinusitis, unspecified: Secondary | ICD-10-CM | POA: Diagnosis not present

## 2020-01-01 DIAGNOSIS — M71572 Other bursitis, not elsewhere classified, left ankle and foot: Secondary | ICD-10-CM | POA: Diagnosis not present

## 2020-01-01 DIAGNOSIS — M7732 Calcaneal spur, left foot: Secondary | ICD-10-CM | POA: Diagnosis not present

## 2020-01-01 DIAGNOSIS — F411 Generalized anxiety disorder: Secondary | ICD-10-CM | POA: Diagnosis not present

## 2020-01-01 DIAGNOSIS — F902 Attention-deficit hyperactivity disorder, combined type: Secondary | ICD-10-CM | POA: Diagnosis not present

## 2020-01-01 DIAGNOSIS — M722 Plantar fascial fibromatosis: Secondary | ICD-10-CM | POA: Diagnosis not present

## 2020-01-01 DIAGNOSIS — F339 Major depressive disorder, recurrent, unspecified: Secondary | ICD-10-CM | POA: Diagnosis not present

## 2020-01-16 DIAGNOSIS — J3089 Other allergic rhinitis: Secondary | ICD-10-CM | POA: Diagnosis not present

## 2020-01-16 DIAGNOSIS — F411 Generalized anxiety disorder: Secondary | ICD-10-CM | POA: Diagnosis not present

## 2020-01-16 DIAGNOSIS — F339 Major depressive disorder, recurrent, unspecified: Secondary | ICD-10-CM | POA: Diagnosis not present

## 2020-01-16 DIAGNOSIS — J3081 Allergic rhinitis due to animal (cat) (dog) hair and dander: Secondary | ICD-10-CM | POA: Diagnosis not present

## 2020-01-16 DIAGNOSIS — J301 Allergic rhinitis due to pollen: Secondary | ICD-10-CM | POA: Diagnosis not present

## 2020-01-19 ENCOUNTER — Ambulatory Visit
Admission: EM | Admit: 2020-01-19 | Discharge: 2020-01-19 | Disposition: A | Discharge status: Home / Self Care | Payer: BC Managed Care – PPO | Attending: Emergency Medicine | Admitting: Emergency Medicine

## 2020-01-19 ENCOUNTER — Ambulatory Visit (HOSPITAL_COMMUNITY): Admit: 2020-01-19 | Disposition: A | Discharge status: Home / Self Care | Payer: BC Managed Care – PPO

## 2020-01-19 ENCOUNTER — Encounter: Payer: Self-pay | Admitting: Emergency Medicine

## 2020-01-19 ENCOUNTER — Other Ambulatory Visit: Payer: Self-pay

## 2020-01-19 DIAGNOSIS — J069 Acute upper respiratory infection, unspecified: Secondary | ICD-10-CM | POA: Diagnosis not present

## 2020-01-19 LAB — POCT RAPID STREP A (OFFICE): Rapid Strep A Screen: NEGATIVE

## 2020-01-19 MED ORDER — PREDNISONE 50 MG PO TABS: 50.0000 mg | ORAL_TABLET | Freq: Every day | ORAL | 0 refills | Status: DC

## 2020-01-19 NOTE — Discharge Instructions (Addendum)

## 2020-01-19 NOTE — ED Triage Notes (Addendum)
3 weeks ago was treated for sinus infection with augmentin.  Symptoms did not go away completely.  Now has ear fullness, cough, sore throat that will not go away, headache has returned.Julia Randall

## 2020-01-19 NOTE — ED Provider Notes (Signed)
[facility]    CSN: [id number] Arrival date & time: 01/19/20  1100       History   Chief Complaint Chief Complaint  Patient presents with  . URI    HPI Julia Randall is a 45 y.o. female  With history of asthma presenting for persistent cough, congestion, sore throat.  States this began 3 weeks ago: Given Augmentin for suspected sinus infection.  States that she had strep and Covid testing done, though they were not processed.  Endorsing ear fullness, dry cough, sore throat.  No difficulty breathing, swallowing.  Past Medical History:  Diagnosis Date  . Asthma     Patient Active Problem List   Diagnosis Date Noted  . Cough variant asthma 02/09/2016    Past Surgical History:  Procedure Laterality Date  . ENDOMETRIAL ABLATION      OB History   No obstetric history on file.      Home Medications    Prior to Admission medications   Medication Sig Start Date End Date Taking? Authorizing Provider  budesonide-formoterol (SYMBICORT) 80-4.5 MCG/ACT inhaler Take 2 puffs first thing in am and then another 2 puffs about 12 hours later. 03/29/16  Yes Tanda Rockers, MD  Cholecalciferol (VITAMIN D3 PO) Take 1 capsule by mouth daily.   Yes [provider]  famotidine (PEPCID) 20 MG tablet Take 20 mg by mouth daily.   Yes [provider]  ketotifen (ZADITOR) 0.025 % ophthalmic solution Place 1 drop into both eyes 2 (two) times daily.   Yes [provider]  levocetirizine (XYZAL) 5 MG tablet Take 5 mg by mouth every evening.   Yes [provider]  Multiple Vitamins-Minerals (MULTIVITAL PO) Take 1 capsule by mouth daily.   Yes [provider]  predniSONE (DELTASONE) 50 MG tablet Take 1 tablet (50 mg total) by mouth daily with breakfast. 01/19/20  Yes Hall-Potvin, [place], PA-C  albuterol (PROVENTIL HFA;VENTOLIN HFA) 108 (90 Base) MCG/ACT inhaler Inhale 2 puffs into the lungs every 6 (six) hours as needed for wheezing  or shortness of breath.    [provider]  Azelastine-Fluticasone 137-50 MCG/ACT SUSP Place 1 spray into the nose 2 (two) times daily. Patient not taking: Reported on 01/19/2020    [provider]  EPINEPHrine 0.3 mg/0.3 mL IJ SOAJ injection Inject 0.3 mg into the muscle once.    [provider]  GuaiFENesin (MUCUS RELIEF ADULT PO) Take by mouth as needed. Patient not taking: Reported on 01/19/2020    [provider]  sertraline (ZOLOFT) 50 MG tablet Take 150 mg by mouth daily.    [provider]    Family History Family History  Problem Relation Age of Onset  . Asthma Mother   . Allergies Mother   . Emphysema Paternal Grandfather     Social History Social History   Tobacco Use  . Smoking status: Never Smoker  . Smokeless tobacco: Never Used  Substance Use Topics  . Alcohol use: Yes    Alcohol/week: 8.0 standard drinks    Types: 8 Glasses of wine per week  . Drug use: No     Allergies   Sulfa antibiotics   Review of Systems Review of Systems  Constitutional: Negative for fatigue and fever.  HENT: Positive for congestion and sore throat. Negative for dental problem, ear pain, facial swelling, hearing loss, sinus pain, trouble swallowing and voice change.   Eyes: Negative for photophobia, pain and visual disturbance.  Respiratory: Positive for cough. Negative  for shortness of breath.   Cardiovascular: Negative for chest pain and palpitations.  Gastrointestinal: Negative for diarrhea and vomiting.  Musculoskeletal: Negative for arthralgias and myalgias.  Neurological: Positive for headaches. Negative for dizziness, facial asymmetry and light-headedness.     Physical Exam Triage Vital Signs ED Triage Vitals  Enc Vitals Group     BP 01/19/20 1120 116/77     Pulse Rate 01/19/20 1120 81     Resp --      Temp 01/19/20 1120 98.4 F (36.9 C)     Temp Source 01/19/20 1120 Oral     SpO2 01/19/20 1120 98 %     Weight --       Height --      Head Circumference --      Peak Flow --      Pain Score 01/19/20 1124 4     Pain Loc --      Pain Edu? --      Excl. in GC? --    No data found.  Updated Vital Signs BP 116/77   Pulse 81   Temp 98.4 F (36.9 C) (Oral)   LMP 01/05/2020   SpO2 98%   Visual Acuity Right Eye Distance:   Left Eye Distance:   Bilateral Distance:    Right Eye Near:   Left Eye Near:    Bilateral Near:     Physical Exam Constitutional:      General: She is not in acute distress.    Appearance: She is not ill-appearing or diaphoretic.  HENT:     Head: Normocephalic and atraumatic.     Right Ear: Tympanic membrane and ear canal normal.     Left Ear: Tympanic membrane and ear canal normal.     Mouth/Throat:     Mouth: Mucous membranes are moist.     Pharynx: Oropharynx is clear. Posterior oropharyngeal erythema present. No oropharyngeal exudate.  Eyes:     General: No scleral icterus.    Conjunctiva/sclera: Conjunctivae normal.     Pupils: Pupils are equal, round, and reactive to light.  Neck:     Comments: Trachea midline, negative JVD Cardiovascular:     Rate and Rhythm: Normal rate and regular rhythm.     Heart sounds: No murmur heard. No gallop.   Pulmonary:     Effort: Pulmonary effort is normal. No respiratory distress.     Breath sounds: No wheezing, rhonchi or rales.  Musculoskeletal:     Cervical back: Neck supple. No tenderness.  Lymphadenopathy:     Cervical: No cervical adenopathy.  Skin:    Capillary Refill: Capillary refill takes less than 2 seconds.     Coloration: Skin is not jaundiced or pale.     Findings: No rash.  Neurological:     General: No focal deficit present.     Mental Status: She is alert and oriented to person, place, and time.      UC Treatments / Results  Labs (all labs ordered are listed, but only abnormal results are displayed) Labs Reviewed  NOVEL CORONAVIRUS, NAA  CULTURE, GROUP A STREP [facility])  POCT RAPID STREP A (OFFICE)     EKG   Radiology No results found.  Procedures Procedures (including critical care time)  Medications Ordered in UC Medications - No data to display  Initial Impression / Assessment and Plan / UC Course  I have reviewed the triage vital signs and the nursing notes.  Pertinent labs & imaging results that were available during  my care of the patient were reviewed by me and considered in my medical decision making (see chart for details).     Patient afebrile, nontoxic, with SpO2 98%.  Rapid strep negative, culture and Covid PCR pending.  Patient to quarantine until results are back.  We will treat supportively as outlined below.  Return precautions discussed, patient verbalized understanding and is agreeable to plan. Final Clinical Impressions(s) / UC Diagnoses   Final diagnoses:  URI with cough and congestion     Discharge Instructions     Your rapid strep test was negative today.  The culture is pending.  Please look on your MyChart for test results.   We will notify you if the culture positive and outline a treatment plan at that time.   Please continue Tylenol and/or Ibuprofen as needed for fever, pain.  May try warm salt water gargles, cepacol lozenges, throat spray, warm tea or water with lemon/honey, or OTC cold relief medicine for throat discomfort.   For congestion: take a daily anti-histamine like Zyrtec, Claritin, and a oral decongestant to help with post nasal drip that may be irritating your throat.   It is important to stay hydrated: drink plenty of fluids (primarily water) to keep your throat moisturized and help further relieve irritation/discomfort.     ED Prescriptions    Medication Sig Dispense Auth. Provider   predniSONE (DELTASONE) 50 MG tablet Take 1 tablet (50 mg total) by mouth daily with breakfast. 5 tablet Hall-Potvin, [place], PA-C     PDMP not reviewed this encounter.   Hall-Potvin, [place], [place] 01/19/20 1215

## 2020-01-20 LAB — NOVEL CORONAVIRUS, NAA: SARS-CoV-2, NAA: NOT DETECTED

## 2020-01-20 LAB — SARS-COV-2, NAA 2 DAY TAT

## 2020-01-22 LAB — CULTURE, GROUP A STREP (THRC)

## 2020-01-29 DIAGNOSIS — L905 Scar conditions and fibrosis of skin: Secondary | ICD-10-CM | POA: Diagnosis not present

## 2020-01-29 DIAGNOSIS — D1801 Hemangioma of skin and subcutaneous tissue: Secondary | ICD-10-CM | POA: Diagnosis not present

## 2020-01-29 DIAGNOSIS — L821 Other seborrheic keratosis: Secondary | ICD-10-CM | POA: Diagnosis not present

## 2020-01-29 DIAGNOSIS — D225 Melanocytic nevi of trunk: Secondary | ICD-10-CM | POA: Diagnosis not present

## 2020-01-30 DIAGNOSIS — J3089 Other allergic rhinitis: Secondary | ICD-10-CM | POA: Diagnosis not present

## 2020-01-30 DIAGNOSIS — F339 Major depressive disorder, recurrent, unspecified: Secondary | ICD-10-CM | POA: Diagnosis not present

## 2020-01-30 DIAGNOSIS — J301 Allergic rhinitis due to pollen: Secondary | ICD-10-CM | POA: Diagnosis not present

## 2020-01-30 DIAGNOSIS — F902 Attention-deficit hyperactivity disorder, combined type: Secondary | ICD-10-CM | POA: Diagnosis not present

## 2020-01-30 DIAGNOSIS — F411 Generalized anxiety disorder: Secondary | ICD-10-CM | POA: Diagnosis not present

## 2020-01-30 DIAGNOSIS — J3081 Allergic rhinitis due to animal (cat) (dog) hair and dander: Secondary | ICD-10-CM | POA: Diagnosis not present

## 2020-02-11 DIAGNOSIS — J3089 Other allergic rhinitis: Secondary | ICD-10-CM | POA: Diagnosis not present

## 2020-02-11 DIAGNOSIS — J3081 Allergic rhinitis due to animal (cat) (dog) hair and dander: Secondary | ICD-10-CM | POA: Diagnosis not present

## 2020-02-11 DIAGNOSIS — J4540 Moderate persistent asthma, uncomplicated: Secondary | ICD-10-CM | POA: Diagnosis not present

## 2020-02-11 DIAGNOSIS — J301 Allergic rhinitis due to pollen: Secondary | ICD-10-CM | POA: Diagnosis not present

## 2020-02-13 DIAGNOSIS — F411 Generalized anxiety disorder: Secondary | ICD-10-CM | POA: Diagnosis not present

## 2020-02-13 DIAGNOSIS — F339 Major depressive disorder, recurrent, unspecified: Secondary | ICD-10-CM | POA: Diagnosis not present

## 2020-02-26 DIAGNOSIS — J3089 Other allergic rhinitis: Secondary | ICD-10-CM | POA: Diagnosis not present

## 2020-02-26 DIAGNOSIS — J301 Allergic rhinitis due to pollen: Secondary | ICD-10-CM | POA: Diagnosis not present

## 2020-02-26 DIAGNOSIS — M9905 Segmental and somatic dysfunction of pelvic region: Secondary | ICD-10-CM | POA: Diagnosis not present

## 2020-02-26 DIAGNOSIS — F4312 Post-traumatic stress disorder, chronic: Secondary | ICD-10-CM | POA: Diagnosis not present

## 2020-02-26 DIAGNOSIS — M6240 Contracture of muscle, unspecified site: Secondary | ICD-10-CM | POA: Diagnosis not present

## 2020-02-26 DIAGNOSIS — M9903 Segmental and somatic dysfunction of lumbar region: Secondary | ICD-10-CM | POA: Diagnosis not present

## 2020-02-26 DIAGNOSIS — F339 Major depressive disorder, recurrent, unspecified: Secondary | ICD-10-CM | POA: Diagnosis not present

## 2020-02-26 DIAGNOSIS — M9902 Segmental and somatic dysfunction of thoracic region: Secondary | ICD-10-CM | POA: Diagnosis not present

## 2020-02-26 DIAGNOSIS — J3081 Allergic rhinitis due to animal (cat) (dog) hair and dander: Secondary | ICD-10-CM | POA: Diagnosis not present

## 2020-02-26 DIAGNOSIS — F902 Attention-deficit hyperactivity disorder, combined type: Secondary | ICD-10-CM | POA: Diagnosis not present

## 2020-02-26 DIAGNOSIS — F411 Generalized anxiety disorder: Secondary | ICD-10-CM | POA: Diagnosis not present

## 2020-02-28 DIAGNOSIS — M6240 Contracture of muscle, unspecified site: Secondary | ICD-10-CM | POA: Diagnosis not present

## 2020-02-28 DIAGNOSIS — M9902 Segmental and somatic dysfunction of thoracic region: Secondary | ICD-10-CM | POA: Diagnosis not present

## 2020-02-28 DIAGNOSIS — M9903 Segmental and somatic dysfunction of lumbar region: Secondary | ICD-10-CM | POA: Diagnosis not present

## 2020-02-28 DIAGNOSIS — M9905 Segmental and somatic dysfunction of pelvic region: Secondary | ICD-10-CM | POA: Diagnosis not present

## 2020-03-04 DIAGNOSIS — J301 Allergic rhinitis due to pollen: Secondary | ICD-10-CM | POA: Diagnosis not present

## 2020-03-04 DIAGNOSIS — M9905 Segmental and somatic dysfunction of pelvic region: Secondary | ICD-10-CM | POA: Diagnosis not present

## 2020-03-04 DIAGNOSIS — J3089 Other allergic rhinitis: Secondary | ICD-10-CM | POA: Diagnosis not present

## 2020-03-04 DIAGNOSIS — M9902 Segmental and somatic dysfunction of thoracic region: Secondary | ICD-10-CM | POA: Diagnosis not present

## 2020-03-04 DIAGNOSIS — J3081 Allergic rhinitis due to animal (cat) (dog) hair and dander: Secondary | ICD-10-CM | POA: Diagnosis not present

## 2020-03-04 DIAGNOSIS — M9903 Segmental and somatic dysfunction of lumbar region: Secondary | ICD-10-CM | POA: Diagnosis not present

## 2020-03-04 DIAGNOSIS — M6240 Contracture of muscle, unspecified site: Secondary | ICD-10-CM | POA: Diagnosis not present

## 2020-03-11 DIAGNOSIS — M9902 Segmental and somatic dysfunction of thoracic region: Secondary | ICD-10-CM | POA: Diagnosis not present

## 2020-03-11 DIAGNOSIS — J3081 Allergic rhinitis due to animal (cat) (dog) hair and dander: Secondary | ICD-10-CM | POA: Diagnosis not present

## 2020-03-11 DIAGNOSIS — F339 Major depressive disorder, recurrent, unspecified: Secondary | ICD-10-CM | POA: Diagnosis not present

## 2020-03-11 DIAGNOSIS — J3089 Other allergic rhinitis: Secondary | ICD-10-CM | POA: Diagnosis not present

## 2020-03-11 DIAGNOSIS — M6240 Contracture of muscle, unspecified site: Secondary | ICD-10-CM | POA: Diagnosis not present

## 2020-03-11 DIAGNOSIS — M9905 Segmental and somatic dysfunction of pelvic region: Secondary | ICD-10-CM | POA: Diagnosis not present

## 2020-03-11 DIAGNOSIS — F411 Generalized anxiety disorder: Secondary | ICD-10-CM | POA: Diagnosis not present

## 2020-03-11 DIAGNOSIS — J301 Allergic rhinitis due to pollen: Secondary | ICD-10-CM | POA: Diagnosis not present

## 2020-03-11 DIAGNOSIS — M9903 Segmental and somatic dysfunction of lumbar region: Secondary | ICD-10-CM | POA: Diagnosis not present

## 2020-03-18 DIAGNOSIS — M9905 Segmental and somatic dysfunction of pelvic region: Secondary | ICD-10-CM | POA: Diagnosis not present

## 2020-03-18 DIAGNOSIS — J3089 Other allergic rhinitis: Secondary | ICD-10-CM | POA: Diagnosis not present

## 2020-03-18 DIAGNOSIS — F331 Major depressive disorder, recurrent, moderate: Secondary | ICD-10-CM | POA: Diagnosis not present

## 2020-03-18 DIAGNOSIS — J301 Allergic rhinitis due to pollen: Secondary | ICD-10-CM | POA: Diagnosis not present

## 2020-03-18 DIAGNOSIS — F4312 Post-traumatic stress disorder, chronic: Secondary | ICD-10-CM | POA: Diagnosis not present

## 2020-03-18 DIAGNOSIS — M9903 Segmental and somatic dysfunction of lumbar region: Secondary | ICD-10-CM | POA: Diagnosis not present

## 2020-03-18 DIAGNOSIS — J3081 Allergic rhinitis due to animal (cat) (dog) hair and dander: Secondary | ICD-10-CM | POA: Diagnosis not present

## 2020-03-18 DIAGNOSIS — F410 Panic disorder [episodic paroxysmal anxiety] without agoraphobia: Secondary | ICD-10-CM | POA: Diagnosis not present

## 2020-03-18 DIAGNOSIS — M9902 Segmental and somatic dysfunction of thoracic region: Secondary | ICD-10-CM | POA: Diagnosis not present

## 2020-03-18 DIAGNOSIS — F411 Generalized anxiety disorder: Secondary | ICD-10-CM | POA: Diagnosis not present

## 2020-03-18 DIAGNOSIS — M6240 Contracture of muscle, unspecified site: Secondary | ICD-10-CM | POA: Diagnosis not present

## 2020-03-25 DIAGNOSIS — M9902 Segmental and somatic dysfunction of thoracic region: Secondary | ICD-10-CM | POA: Diagnosis not present

## 2020-03-25 DIAGNOSIS — M9903 Segmental and somatic dysfunction of lumbar region: Secondary | ICD-10-CM | POA: Diagnosis not present

## 2020-03-25 DIAGNOSIS — J3081 Allergic rhinitis due to animal (cat) (dog) hair and dander: Secondary | ICD-10-CM | POA: Diagnosis not present

## 2020-03-25 DIAGNOSIS — F902 Attention-deficit hyperactivity disorder, combined type: Secondary | ICD-10-CM | POA: Diagnosis not present

## 2020-03-25 DIAGNOSIS — F339 Major depressive disorder, recurrent, unspecified: Secondary | ICD-10-CM | POA: Diagnosis not present

## 2020-03-25 DIAGNOSIS — J3089 Other allergic rhinitis: Secondary | ICD-10-CM | POA: Diagnosis not present

## 2020-03-25 DIAGNOSIS — F411 Generalized anxiety disorder: Secondary | ICD-10-CM | POA: Diagnosis not present

## 2020-03-25 DIAGNOSIS — M9905 Segmental and somatic dysfunction of pelvic region: Secondary | ICD-10-CM | POA: Diagnosis not present

## 2020-03-25 DIAGNOSIS — J301 Allergic rhinitis due to pollen: Secondary | ICD-10-CM | POA: Diagnosis not present

## 2020-04-01 DIAGNOSIS — J3089 Other allergic rhinitis: Secondary | ICD-10-CM | POA: Diagnosis not present

## 2020-04-01 DIAGNOSIS — J301 Allergic rhinitis due to pollen: Secondary | ICD-10-CM | POA: Diagnosis not present

## 2020-04-01 DIAGNOSIS — M9905 Segmental and somatic dysfunction of pelvic region: Secondary | ICD-10-CM | POA: Diagnosis not present

## 2020-04-01 DIAGNOSIS — M9902 Segmental and somatic dysfunction of thoracic region: Secondary | ICD-10-CM | POA: Diagnosis not present

## 2020-04-01 DIAGNOSIS — M9903 Segmental and somatic dysfunction of lumbar region: Secondary | ICD-10-CM | POA: Diagnosis not present

## 2020-04-01 DIAGNOSIS — J3081 Allergic rhinitis due to animal (cat) (dog) hair and dander: Secondary | ICD-10-CM | POA: Diagnosis not present

## 2020-04-08 DIAGNOSIS — F331 Major depressive disorder, recurrent, moderate: Secondary | ICD-10-CM | POA: Diagnosis not present

## 2020-04-08 DIAGNOSIS — J301 Allergic rhinitis due to pollen: Secondary | ICD-10-CM | POA: Diagnosis not present

## 2020-04-08 DIAGNOSIS — J3081 Allergic rhinitis due to animal (cat) (dog) hair and dander: Secondary | ICD-10-CM | POA: Diagnosis not present

## 2020-04-08 DIAGNOSIS — F411 Generalized anxiety disorder: Secondary | ICD-10-CM | POA: Diagnosis not present

## 2020-04-08 DIAGNOSIS — M9903 Segmental and somatic dysfunction of lumbar region: Secondary | ICD-10-CM | POA: Diagnosis not present

## 2020-04-08 DIAGNOSIS — F902 Attention-deficit hyperactivity disorder, combined type: Secondary | ICD-10-CM | POA: Diagnosis not present

## 2020-04-08 DIAGNOSIS — M9905 Segmental and somatic dysfunction of pelvic region: Secondary | ICD-10-CM | POA: Diagnosis not present

## 2020-04-08 DIAGNOSIS — M9902 Segmental and somatic dysfunction of thoracic region: Secondary | ICD-10-CM | POA: Diagnosis not present

## 2020-04-08 DIAGNOSIS — J3089 Other allergic rhinitis: Secondary | ICD-10-CM | POA: Diagnosis not present

## 2020-04-15 DIAGNOSIS — J301 Allergic rhinitis due to pollen: Secondary | ICD-10-CM | POA: Diagnosis not present

## 2020-04-15 DIAGNOSIS — M9902 Segmental and somatic dysfunction of thoracic region: Secondary | ICD-10-CM | POA: Diagnosis not present

## 2020-04-15 DIAGNOSIS — J3089 Other allergic rhinitis: Secondary | ICD-10-CM | POA: Diagnosis not present

## 2020-04-15 DIAGNOSIS — J3081 Allergic rhinitis due to animal (cat) (dog) hair and dander: Secondary | ICD-10-CM | POA: Diagnosis not present

## 2020-04-15 DIAGNOSIS — M9903 Segmental and somatic dysfunction of lumbar region: Secondary | ICD-10-CM | POA: Diagnosis not present

## 2020-04-15 DIAGNOSIS — M9905 Segmental and somatic dysfunction of pelvic region: Secondary | ICD-10-CM | POA: Diagnosis not present

## 2020-04-22 DIAGNOSIS — M9903 Segmental and somatic dysfunction of lumbar region: Secondary | ICD-10-CM | POA: Diagnosis not present

## 2020-04-22 DIAGNOSIS — M9905 Segmental and somatic dysfunction of pelvic region: Secondary | ICD-10-CM | POA: Diagnosis not present

## 2020-04-22 DIAGNOSIS — F411 Generalized anxiety disorder: Secondary | ICD-10-CM | POA: Diagnosis not present

## 2020-04-22 DIAGNOSIS — M9902 Segmental and somatic dysfunction of thoracic region: Secondary | ICD-10-CM | POA: Diagnosis not present

## 2020-04-22 DIAGNOSIS — F902 Attention-deficit hyperactivity disorder, combined type: Secondary | ICD-10-CM | POA: Diagnosis not present

## 2020-04-22 DIAGNOSIS — F331 Major depressive disorder, recurrent, moderate: Secondary | ICD-10-CM | POA: Diagnosis not present

## 2020-04-24 DIAGNOSIS — J3089 Other allergic rhinitis: Secondary | ICD-10-CM | POA: Diagnosis not present

## 2020-04-24 DIAGNOSIS — J3081 Allergic rhinitis due to animal (cat) (dog) hair and dander: Secondary | ICD-10-CM | POA: Diagnosis not present

## 2020-04-24 DIAGNOSIS — J301 Allergic rhinitis due to pollen: Secondary | ICD-10-CM | POA: Diagnosis not present

## 2020-04-25 DIAGNOSIS — J3089 Other allergic rhinitis: Secondary | ICD-10-CM | POA: Diagnosis not present

## 2020-04-25 DIAGNOSIS — J301 Allergic rhinitis due to pollen: Secondary | ICD-10-CM | POA: Diagnosis not present

## 2020-04-25 DIAGNOSIS — J3081 Allergic rhinitis due to animal (cat) (dog) hair and dander: Secondary | ICD-10-CM | POA: Diagnosis not present

## 2020-04-29 ENCOUNTER — Ambulatory Visit
Admission: RE | Admit: 2020-04-29 | Discharge: 2020-04-29 | Disposition: A | Discharge status: Home / Self Care | Payer: BC Managed Care – PPO | Source: Ambulatory Visit | Attending: Family Medicine | Admitting: Family Medicine

## 2020-04-29 ENCOUNTER — Other Ambulatory Visit: Payer: Self-pay | Admitting: Family Medicine

## 2020-04-29 ENCOUNTER — Other Ambulatory Visit: Payer: Self-pay

## 2020-04-29 DIAGNOSIS — R200 Anesthesia of skin: Secondary | ICD-10-CM | POA: Diagnosis not present

## 2020-05-06 DIAGNOSIS — J3089 Other allergic rhinitis: Secondary | ICD-10-CM | POA: Diagnosis not present

## 2020-05-06 DIAGNOSIS — J301 Allergic rhinitis due to pollen: Secondary | ICD-10-CM | POA: Diagnosis not present

## 2020-05-06 DIAGNOSIS — F902 Attention-deficit hyperactivity disorder, combined type: Secondary | ICD-10-CM | POA: Diagnosis not present

## 2020-05-06 DIAGNOSIS — F331 Major depressive disorder, recurrent, moderate: Secondary | ICD-10-CM | POA: Diagnosis not present

## 2020-05-06 DIAGNOSIS — F411 Generalized anxiety disorder: Secondary | ICD-10-CM | POA: Diagnosis not present

## 2020-05-06 DIAGNOSIS — J3081 Allergic rhinitis due to animal (cat) (dog) hair and dander: Secondary | ICD-10-CM | POA: Diagnosis not present

## 2020-05-13 DIAGNOSIS — J3081 Allergic rhinitis due to animal (cat) (dog) hair and dander: Secondary | ICD-10-CM | POA: Diagnosis not present

## 2020-05-13 DIAGNOSIS — M9905 Segmental and somatic dysfunction of pelvic region: Secondary | ICD-10-CM | POA: Diagnosis not present

## 2020-05-13 DIAGNOSIS — M9903 Segmental and somatic dysfunction of lumbar region: Secondary | ICD-10-CM | POA: Diagnosis not present

## 2020-05-13 DIAGNOSIS — J3089 Other allergic rhinitis: Secondary | ICD-10-CM | POA: Diagnosis not present

## 2020-05-13 DIAGNOSIS — M9902 Segmental and somatic dysfunction of thoracic region: Secondary | ICD-10-CM | POA: Diagnosis not present

## 2020-05-13 DIAGNOSIS — J301 Allergic rhinitis due to pollen: Secondary | ICD-10-CM | POA: Diagnosis not present

## 2020-06-01 DIAGNOSIS — M9902 Segmental and somatic dysfunction of thoracic region: Secondary | ICD-10-CM | POA: Diagnosis not present

## 2020-06-01 DIAGNOSIS — M9905 Segmental and somatic dysfunction of pelvic region: Secondary | ICD-10-CM | POA: Diagnosis not present

## 2020-06-01 DIAGNOSIS — M9903 Segmental and somatic dysfunction of lumbar region: Secondary | ICD-10-CM | POA: Diagnosis not present

## 2020-06-03 DIAGNOSIS — J301 Allergic rhinitis due to pollen: Secondary | ICD-10-CM | POA: Diagnosis not present

## 2020-06-03 DIAGNOSIS — J3081 Allergic rhinitis due to animal (cat) (dog) hair and dander: Secondary | ICD-10-CM | POA: Diagnosis not present

## 2020-06-03 DIAGNOSIS — J3089 Other allergic rhinitis: Secondary | ICD-10-CM | POA: Diagnosis not present

## 2020-06-06 DIAGNOSIS — S86919A Strain of unspecified muscle(s) and tendon(s) at lower leg level, unspecified leg, initial encounter: Secondary | ICD-10-CM | POA: Diagnosis not present

## 2020-06-10 DIAGNOSIS — J3089 Other allergic rhinitis: Secondary | ICD-10-CM | POA: Diagnosis not present

## 2020-06-10 DIAGNOSIS — F410 Panic disorder [episodic paroxysmal anxiety] without agoraphobia: Secondary | ICD-10-CM | POA: Diagnosis not present

## 2020-06-10 DIAGNOSIS — F411 Generalized anxiety disorder: Secondary | ICD-10-CM | POA: Diagnosis not present

## 2020-06-10 DIAGNOSIS — F331 Major depressive disorder, recurrent, moderate: Secondary | ICD-10-CM | POA: Diagnosis not present

## 2020-06-10 DIAGNOSIS — J3081 Allergic rhinitis due to animal (cat) (dog) hair and dander: Secondary | ICD-10-CM | POA: Diagnosis not present

## 2020-06-10 DIAGNOSIS — F4312 Post-traumatic stress disorder, chronic: Secondary | ICD-10-CM | POA: Diagnosis not present

## 2020-06-10 DIAGNOSIS — J301 Allergic rhinitis due to pollen: Secondary | ICD-10-CM | POA: Diagnosis not present

## 2020-06-18 DIAGNOSIS — F902 Attention-deficit hyperactivity disorder, combined type: Secondary | ICD-10-CM | POA: Diagnosis not present

## 2020-06-18 DIAGNOSIS — F411 Generalized anxiety disorder: Secondary | ICD-10-CM | POA: Diagnosis not present

## 2020-06-23 DIAGNOSIS — J3081 Allergic rhinitis due to animal (cat) (dog) hair and dander: Secondary | ICD-10-CM | POA: Diagnosis not present

## 2020-06-23 DIAGNOSIS — J3089 Other allergic rhinitis: Secondary | ICD-10-CM | POA: Diagnosis not present

## 2020-06-23 DIAGNOSIS — J301 Allergic rhinitis due to pollen: Secondary | ICD-10-CM | POA: Diagnosis not present

## 2020-07-01 DIAGNOSIS — F411 Generalized anxiety disorder: Secondary | ICD-10-CM | POA: Diagnosis not present

## 2020-07-01 DIAGNOSIS — F331 Major depressive disorder, recurrent, moderate: Secondary | ICD-10-CM | POA: Diagnosis not present

## 2020-07-01 DIAGNOSIS — F902 Attention-deficit hyperactivity disorder, combined type: Secondary | ICD-10-CM | POA: Diagnosis not present

## 2020-08-06 DIAGNOSIS — F339 Major depressive disorder, recurrent, unspecified: Secondary | ICD-10-CM | POA: Diagnosis not present

## 2020-08-06 DIAGNOSIS — F411 Generalized anxiety disorder: Secondary | ICD-10-CM | POA: Diagnosis not present

## 2020-08-06 DIAGNOSIS — F4312 Post-traumatic stress disorder, chronic: Secondary | ICD-10-CM | POA: Diagnosis not present

## 2020-08-21 DIAGNOSIS — J301 Allergic rhinitis due to pollen: Secondary | ICD-10-CM | POA: Diagnosis not present

## 2020-08-21 DIAGNOSIS — J3081 Allergic rhinitis due to animal (cat) (dog) hair and dander: Secondary | ICD-10-CM | POA: Diagnosis not present

## 2020-08-21 DIAGNOSIS — J3089 Other allergic rhinitis: Secondary | ICD-10-CM | POA: Diagnosis not present

## 2020-09-25 DIAGNOSIS — J3089 Other allergic rhinitis: Secondary | ICD-10-CM | POA: Diagnosis not present

## 2020-09-25 DIAGNOSIS — J301 Allergic rhinitis due to pollen: Secondary | ICD-10-CM | POA: Diagnosis not present

## 2020-09-25 DIAGNOSIS — J3081 Allergic rhinitis due to animal (cat) (dog) hair and dander: Secondary | ICD-10-CM | POA: Diagnosis not present

## 2020-09-29 DIAGNOSIS — J301 Allergic rhinitis due to pollen: Secondary | ICD-10-CM | POA: Diagnosis not present

## 2020-09-29 DIAGNOSIS — J3081 Allergic rhinitis due to animal (cat) (dog) hair and dander: Secondary | ICD-10-CM | POA: Diagnosis not present

## 2020-09-29 DIAGNOSIS — J3089 Other allergic rhinitis: Secondary | ICD-10-CM | POA: Diagnosis not present

## 2020-10-02 DIAGNOSIS — J301 Allergic rhinitis due to pollen: Secondary | ICD-10-CM | POA: Diagnosis not present

## 2020-10-02 DIAGNOSIS — J3081 Allergic rhinitis due to animal (cat) (dog) hair and dander: Secondary | ICD-10-CM | POA: Diagnosis not present

## 2020-10-02 DIAGNOSIS — J3089 Other allergic rhinitis: Secondary | ICD-10-CM | POA: Diagnosis not present

## 2020-10-09 DIAGNOSIS — J3089 Other allergic rhinitis: Secondary | ICD-10-CM | POA: Diagnosis not present

## 2020-10-09 DIAGNOSIS — J301 Allergic rhinitis due to pollen: Secondary | ICD-10-CM | POA: Diagnosis not present

## 2020-10-09 DIAGNOSIS — J3081 Allergic rhinitis due to animal (cat) (dog) hair and dander: Secondary | ICD-10-CM | POA: Diagnosis not present

## 2020-10-14 DIAGNOSIS — Z13220 Encounter for screening for lipoid disorders: Secondary | ICD-10-CM | POA: Diagnosis not present

## 2020-10-14 DIAGNOSIS — Z0000 Encounter for general adult medical examination without abnormal findings: Secondary | ICD-10-CM | POA: Diagnosis not present

## 2020-10-14 DIAGNOSIS — R946 Abnormal results of thyroid function studies: Secondary | ICD-10-CM | POA: Diagnosis not present

## 2020-10-14 DIAGNOSIS — Z23 Encounter for immunization: Secondary | ICD-10-CM | POA: Diagnosis not present

## 2020-10-14 DIAGNOSIS — R5383 Other fatigue: Secondary | ICD-10-CM | POA: Diagnosis not present

## 2020-10-16 DIAGNOSIS — J301 Allergic rhinitis due to pollen: Secondary | ICD-10-CM | POA: Diagnosis not present

## 2020-10-16 DIAGNOSIS — J3089 Other allergic rhinitis: Secondary | ICD-10-CM | POA: Diagnosis not present

## 2020-10-19 DIAGNOSIS — J301 Allergic rhinitis due to pollen: Secondary | ICD-10-CM | POA: Diagnosis not present

## 2020-10-19 DIAGNOSIS — J3089 Other allergic rhinitis: Secondary | ICD-10-CM | POA: Diagnosis not present

## 2020-10-19 DIAGNOSIS — J3081 Allergic rhinitis due to animal (cat) (dog) hair and dander: Secondary | ICD-10-CM | POA: Diagnosis not present

## 2020-10-26 DIAGNOSIS — J3081 Allergic rhinitis due to animal (cat) (dog) hair and dander: Secondary | ICD-10-CM | POA: Diagnosis not present

## 2020-10-26 DIAGNOSIS — J301 Allergic rhinitis due to pollen: Secondary | ICD-10-CM | POA: Diagnosis not present

## 2020-10-26 DIAGNOSIS — J3089 Other allergic rhinitis: Secondary | ICD-10-CM | POA: Diagnosis not present

## 2020-10-28 ENCOUNTER — Other Ambulatory Visit: Payer: Self-pay | Admitting: Obstetrics and Gynecology

## 2020-10-28 DIAGNOSIS — F411 Generalized anxiety disorder: Secondary | ICD-10-CM | POA: Diagnosis not present

## 2020-10-28 DIAGNOSIS — Z1231 Encounter for screening mammogram for malignant neoplasm of breast: Secondary | ICD-10-CM

## 2020-10-29 DIAGNOSIS — F411 Generalized anxiety disorder: Secondary | ICD-10-CM | POA: Diagnosis not present

## 2020-10-29 DIAGNOSIS — F339 Major depressive disorder, recurrent, unspecified: Secondary | ICD-10-CM | POA: Diagnosis not present

## 2020-10-30 DIAGNOSIS — J3089 Other allergic rhinitis: Secondary | ICD-10-CM | POA: Diagnosis not present

## 2020-10-30 DIAGNOSIS — J301 Allergic rhinitis due to pollen: Secondary | ICD-10-CM | POA: Diagnosis not present

## 2020-10-30 DIAGNOSIS — J3081 Allergic rhinitis due to animal (cat) (dog) hair and dander: Secondary | ICD-10-CM | POA: Diagnosis not present

## 2020-11-02 DIAGNOSIS — G479 Sleep disorder, unspecified: Secondary | ICD-10-CM | POA: Diagnosis not present

## 2020-11-02 DIAGNOSIS — E063 Autoimmune thyroiditis: Secondary | ICD-10-CM | POA: Diagnosis not present

## 2020-11-02 DIAGNOSIS — E039 Hypothyroidism, unspecified: Secondary | ICD-10-CM | POA: Diagnosis not present

## 2020-11-02 DIAGNOSIS — E559 Vitamin D deficiency, unspecified: Secondary | ICD-10-CM | POA: Diagnosis not present

## 2020-11-02 DIAGNOSIS — E538 Deficiency of other specified B group vitamins: Secondary | ICD-10-CM | POA: Diagnosis not present

## 2020-11-02 DIAGNOSIS — R946 Abnormal results of thyroid function studies: Secondary | ICD-10-CM | POA: Diagnosis not present

## 2020-11-03 DIAGNOSIS — J3089 Other allergic rhinitis: Secondary | ICD-10-CM | POA: Diagnosis not present

## 2020-11-03 DIAGNOSIS — J301 Allergic rhinitis due to pollen: Secondary | ICD-10-CM | POA: Diagnosis not present

## 2020-11-03 DIAGNOSIS — J3081 Allergic rhinitis due to animal (cat) (dog) hair and dander: Secondary | ICD-10-CM | POA: Diagnosis not present

## 2020-11-04 DIAGNOSIS — F411 Generalized anxiety disorder: Secondary | ICD-10-CM | POA: Diagnosis not present

## 2020-11-06 DIAGNOSIS — J3089 Other allergic rhinitis: Secondary | ICD-10-CM | POA: Diagnosis not present

## 2020-11-06 DIAGNOSIS — J3081 Allergic rhinitis due to animal (cat) (dog) hair and dander: Secondary | ICD-10-CM | POA: Diagnosis not present

## 2020-11-06 DIAGNOSIS — J301 Allergic rhinitis due to pollen: Secondary | ICD-10-CM | POA: Diagnosis not present

## 2020-11-11 DIAGNOSIS — J301 Allergic rhinitis due to pollen: Secondary | ICD-10-CM | POA: Diagnosis not present

## 2020-11-11 DIAGNOSIS — J3089 Other allergic rhinitis: Secondary | ICD-10-CM | POA: Diagnosis not present

## 2020-11-11 DIAGNOSIS — J3081 Allergic rhinitis due to animal (cat) (dog) hair and dander: Secondary | ICD-10-CM | POA: Diagnosis not present

## 2020-11-18 DIAGNOSIS — J3081 Allergic rhinitis due to animal (cat) (dog) hair and dander: Secondary | ICD-10-CM | POA: Diagnosis not present

## 2020-11-18 DIAGNOSIS — J301 Allergic rhinitis due to pollen: Secondary | ICD-10-CM | POA: Diagnosis not present

## 2020-11-18 DIAGNOSIS — J3089 Other allergic rhinitis: Secondary | ICD-10-CM | POA: Diagnosis not present

## 2020-11-18 DIAGNOSIS — F411 Generalized anxiety disorder: Secondary | ICD-10-CM | POA: Diagnosis not present

## 2020-11-25 ENCOUNTER — Other Ambulatory Visit: Payer: Self-pay

## 2020-11-25 ENCOUNTER — Ambulatory Visit
Admission: RE | Admit: 2020-11-25 | Discharge: 2020-11-25 | Disposition: A | Discharge status: Home / Self Care | Payer: BC Managed Care – PPO | Source: Ambulatory Visit | Attending: Obstetrics and Gynecology | Admitting: Obstetrics and Gynecology

## 2020-11-25 DIAGNOSIS — Z1231 Encounter for screening mammogram for malignant neoplasm of breast: Secondary | ICD-10-CM

## 2020-11-26 DIAGNOSIS — F411 Generalized anxiety disorder: Secondary | ICD-10-CM | POA: Diagnosis not present

## 2020-11-26 DIAGNOSIS — F331 Major depressive disorder, recurrent, moderate: Secondary | ICD-10-CM | POA: Diagnosis not present

## 2020-12-02 DIAGNOSIS — F411 Generalized anxiety disorder: Secondary | ICD-10-CM | POA: Diagnosis not present

## 2020-12-02 DIAGNOSIS — F339 Major depressive disorder, recurrent, unspecified: Secondary | ICD-10-CM | POA: Diagnosis not present

## 2020-12-02 DIAGNOSIS — J301 Allergic rhinitis due to pollen: Secondary | ICD-10-CM | POA: Diagnosis not present

## 2020-12-02 DIAGNOSIS — J3081 Allergic rhinitis due to animal (cat) (dog) hair and dander: Secondary | ICD-10-CM | POA: Diagnosis not present

## 2020-12-02 DIAGNOSIS — J3089 Other allergic rhinitis: Secondary | ICD-10-CM | POA: Diagnosis not present

## 2020-12-08 DIAGNOSIS — J3081 Allergic rhinitis due to animal (cat) (dog) hair and dander: Secondary | ICD-10-CM | POA: Diagnosis not present

## 2020-12-08 DIAGNOSIS — J301 Allergic rhinitis due to pollen: Secondary | ICD-10-CM | POA: Diagnosis not present

## 2020-12-08 DIAGNOSIS — J3089 Other allergic rhinitis: Secondary | ICD-10-CM | POA: Diagnosis not present

## 2020-12-16 DIAGNOSIS — F339 Major depressive disorder, recurrent, unspecified: Secondary | ICD-10-CM | POA: Diagnosis not present

## 2020-12-16 DIAGNOSIS — F411 Generalized anxiety disorder: Secondary | ICD-10-CM | POA: Diagnosis not present

## 2020-12-30 DIAGNOSIS — E559 Vitamin D deficiency, unspecified: Secondary | ICD-10-CM | POA: Diagnosis not present

## 2020-12-30 DIAGNOSIS — E538 Deficiency of other specified B group vitamins: Secondary | ICD-10-CM | POA: Diagnosis not present

## 2020-12-30 DIAGNOSIS — E039 Hypothyroidism, unspecified: Secondary | ICD-10-CM | POA: Diagnosis not present

## 2020-12-30 DIAGNOSIS — F411 Generalized anxiety disorder: Secondary | ICD-10-CM | POA: Diagnosis not present

## 2020-12-31 DIAGNOSIS — J3089 Other allergic rhinitis: Secondary | ICD-10-CM | POA: Diagnosis not present

## 2020-12-31 DIAGNOSIS — J301 Allergic rhinitis due to pollen: Secondary | ICD-10-CM | POA: Diagnosis not present

## 2020-12-31 DIAGNOSIS — J3081 Allergic rhinitis due to animal (cat) (dog) hair and dander: Secondary | ICD-10-CM | POA: Diagnosis not present

## 2021-01-04 DIAGNOSIS — E063 Autoimmune thyroiditis: Secondary | ICD-10-CM | POA: Diagnosis not present

## 2021-01-04 DIAGNOSIS — E039 Hypothyroidism, unspecified: Secondary | ICD-10-CM | POA: Diagnosis not present

## 2021-01-04 DIAGNOSIS — G479 Sleep disorder, unspecified: Secondary | ICD-10-CM | POA: Diagnosis not present

## 2021-01-04 DIAGNOSIS — R946 Abnormal results of thyroid function studies: Secondary | ICD-10-CM | POA: Diagnosis not present

## 2021-01-06 DIAGNOSIS — J301 Allergic rhinitis due to pollen: Secondary | ICD-10-CM | POA: Diagnosis not present

## 2021-01-06 DIAGNOSIS — J3081 Allergic rhinitis due to animal (cat) (dog) hair and dander: Secondary | ICD-10-CM | POA: Diagnosis not present

## 2021-01-06 DIAGNOSIS — J3089 Other allergic rhinitis: Secondary | ICD-10-CM | POA: Diagnosis not present

## 2021-01-13 DIAGNOSIS — F411 Generalized anxiety disorder: Secondary | ICD-10-CM | POA: Diagnosis not present

## 2021-01-15 DIAGNOSIS — J3081 Allergic rhinitis due to animal (cat) (dog) hair and dander: Secondary | ICD-10-CM | POA: Diagnosis not present

## 2021-01-15 DIAGNOSIS — J301 Allergic rhinitis due to pollen: Secondary | ICD-10-CM | POA: Diagnosis not present

## 2021-01-15 DIAGNOSIS — J3089 Other allergic rhinitis: Secondary | ICD-10-CM | POA: Diagnosis not present

## 2021-01-20 DIAGNOSIS — J3081 Allergic rhinitis due to animal (cat) (dog) hair and dander: Secondary | ICD-10-CM | POA: Diagnosis not present

## 2021-01-20 DIAGNOSIS — J3089 Other allergic rhinitis: Secondary | ICD-10-CM | POA: Diagnosis not present

## 2021-01-20 DIAGNOSIS — J301 Allergic rhinitis due to pollen: Secondary | ICD-10-CM | POA: Diagnosis not present

## 2021-01-26 DIAGNOSIS — J3081 Allergic rhinitis due to animal (cat) (dog) hair and dander: Secondary | ICD-10-CM | POA: Diagnosis not present

## 2021-01-26 DIAGNOSIS — J3089 Other allergic rhinitis: Secondary | ICD-10-CM | POA: Diagnosis not present

## 2021-01-26 DIAGNOSIS — J301 Allergic rhinitis due to pollen: Secondary | ICD-10-CM | POA: Diagnosis not present

## 2021-01-27 DIAGNOSIS — L821 Other seborrheic keratosis: Secondary | ICD-10-CM | POA: Diagnosis not present

## 2021-01-27 DIAGNOSIS — D2272 Melanocytic nevi of left lower limb, including hip: Secondary | ICD-10-CM | POA: Diagnosis not present

## 2021-01-27 DIAGNOSIS — F411 Generalized anxiety disorder: Secondary | ICD-10-CM | POA: Diagnosis not present

## 2021-01-27 DIAGNOSIS — D225 Melanocytic nevi of trunk: Secondary | ICD-10-CM | POA: Diagnosis not present

## 2021-01-27 DIAGNOSIS — L814 Other melanin hyperpigmentation: Secondary | ICD-10-CM | POA: Diagnosis not present

## 2021-02-05 DIAGNOSIS — J3089 Other allergic rhinitis: Secondary | ICD-10-CM | POA: Diagnosis not present

## 2021-02-05 DIAGNOSIS — J3081 Allergic rhinitis due to animal (cat) (dog) hair and dander: Secondary | ICD-10-CM | POA: Diagnosis not present

## 2021-02-05 DIAGNOSIS — J301 Allergic rhinitis due to pollen: Secondary | ICD-10-CM | POA: Diagnosis not present

## 2021-02-10 DIAGNOSIS — J4540 Moderate persistent asthma, uncomplicated: Secondary | ICD-10-CM | POA: Diagnosis not present

## 2021-02-10 DIAGNOSIS — F411 Generalized anxiety disorder: Secondary | ICD-10-CM | POA: Diagnosis not present

## 2021-02-10 DIAGNOSIS — J301 Allergic rhinitis due to pollen: Secondary | ICD-10-CM | POA: Diagnosis not present

## 2021-02-10 DIAGNOSIS — J3081 Allergic rhinitis due to animal (cat) (dog) hair and dander: Secondary | ICD-10-CM | POA: Diagnosis not present

## 2021-02-10 DIAGNOSIS — J3089 Other allergic rhinitis: Secondary | ICD-10-CM | POA: Diagnosis not present

## 2021-02-17 DIAGNOSIS — F4312 Post-traumatic stress disorder, chronic: Secondary | ICD-10-CM | POA: Diagnosis not present

## 2021-02-17 DIAGNOSIS — F331 Major depressive disorder, recurrent, moderate: Secondary | ICD-10-CM | POA: Diagnosis not present

## 2021-02-17 DIAGNOSIS — F411 Generalized anxiety disorder: Secondary | ICD-10-CM | POA: Diagnosis not present

## 2021-02-19 DIAGNOSIS — J3081 Allergic rhinitis due to animal (cat) (dog) hair and dander: Secondary | ICD-10-CM | POA: Diagnosis not present

## 2021-02-19 DIAGNOSIS — J3089 Other allergic rhinitis: Secondary | ICD-10-CM | POA: Diagnosis not present

## 2021-02-19 DIAGNOSIS — J301 Allergic rhinitis due to pollen: Secondary | ICD-10-CM | POA: Diagnosis not present

## 2021-02-24 DIAGNOSIS — F411 Generalized anxiety disorder: Secondary | ICD-10-CM | POA: Diagnosis not present

## 2021-02-24 DIAGNOSIS — F902 Attention-deficit hyperactivity disorder, combined type: Secondary | ICD-10-CM | POA: Diagnosis not present

## 2021-02-26 DIAGNOSIS — J3081 Allergic rhinitis due to animal (cat) (dog) hair and dander: Secondary | ICD-10-CM | POA: Diagnosis not present

## 2021-02-26 DIAGNOSIS — J3089 Other allergic rhinitis: Secondary | ICD-10-CM | POA: Diagnosis not present

## 2021-02-26 DIAGNOSIS — J301 Allergic rhinitis due to pollen: Secondary | ICD-10-CM | POA: Diagnosis not present

## 2021-03-10 DIAGNOSIS — F411 Generalized anxiety disorder: Secondary | ICD-10-CM | POA: Diagnosis not present

## 2021-03-23 DIAGNOSIS — J3089 Other allergic rhinitis: Secondary | ICD-10-CM | POA: Diagnosis not present

## 2021-03-23 DIAGNOSIS — J301 Allergic rhinitis due to pollen: Secondary | ICD-10-CM | POA: Diagnosis not present

## 2021-03-23 DIAGNOSIS — J3081 Allergic rhinitis due to animal (cat) (dog) hair and dander: Secondary | ICD-10-CM | POA: Diagnosis not present

## 2021-03-25 DIAGNOSIS — F411 Generalized anxiety disorder: Secondary | ICD-10-CM | POA: Diagnosis not present

## 2021-03-31 DIAGNOSIS — J3089 Other allergic rhinitis: Secondary | ICD-10-CM | POA: Diagnosis not present

## 2021-03-31 DIAGNOSIS — J3081 Allergic rhinitis due to animal (cat) (dog) hair and dander: Secondary | ICD-10-CM | POA: Diagnosis not present

## 2021-03-31 DIAGNOSIS — J301 Allergic rhinitis due to pollen: Secondary | ICD-10-CM | POA: Diagnosis not present

## 2021-04-05 DIAGNOSIS — E559 Vitamin D deficiency, unspecified: Secondary | ICD-10-CM | POA: Diagnosis not present

## 2021-04-05 DIAGNOSIS — E039 Hypothyroidism, unspecified: Secondary | ICD-10-CM | POA: Diagnosis not present

## 2021-04-08 DIAGNOSIS — F411 Generalized anxiety disorder: Secondary | ICD-10-CM | POA: Diagnosis not present

## 2021-04-09 DIAGNOSIS — J3089 Other allergic rhinitis: Secondary | ICD-10-CM | POA: Diagnosis not present

## 2021-04-09 DIAGNOSIS — J4540 Moderate persistent asthma, uncomplicated: Secondary | ICD-10-CM | POA: Diagnosis not present

## 2021-04-09 DIAGNOSIS — J301 Allergic rhinitis due to pollen: Secondary | ICD-10-CM | POA: Diagnosis not present

## 2021-04-09 DIAGNOSIS — J3081 Allergic rhinitis due to animal (cat) (dog) hair and dander: Secondary | ICD-10-CM | POA: Diagnosis not present

## 2021-04-12 DIAGNOSIS — R946 Abnormal results of thyroid function studies: Secondary | ICD-10-CM | POA: Diagnosis not present

## 2021-04-12 DIAGNOSIS — E039 Hypothyroidism, unspecified: Secondary | ICD-10-CM | POA: Diagnosis not present

## 2021-04-12 DIAGNOSIS — G479 Sleep disorder, unspecified: Secondary | ICD-10-CM | POA: Diagnosis not present

## 2021-04-12 DIAGNOSIS — E063 Autoimmune thyroiditis: Secondary | ICD-10-CM | POA: Diagnosis not present

## 2021-04-13 DIAGNOSIS — J3081 Allergic rhinitis due to animal (cat) (dog) hair and dander: Secondary | ICD-10-CM | POA: Diagnosis not present

## 2021-04-13 DIAGNOSIS — J3089 Other allergic rhinitis: Secondary | ICD-10-CM | POA: Diagnosis not present

## 2021-04-13 DIAGNOSIS — J301 Allergic rhinitis due to pollen: Secondary | ICD-10-CM | POA: Diagnosis not present

## 2021-04-21 DIAGNOSIS — J069 Acute upper respiratory infection, unspecified: Secondary | ICD-10-CM | POA: Diagnosis not present

## 2021-04-21 DIAGNOSIS — R509 Fever, unspecified: Secondary | ICD-10-CM | POA: Diagnosis not present

## 2021-04-21 DIAGNOSIS — Z20822 Contact with and (suspected) exposure to covid-19: Secondary | ICD-10-CM | POA: Diagnosis not present

## 2021-04-30 DIAGNOSIS — J301 Allergic rhinitis due to pollen: Secondary | ICD-10-CM | POA: Diagnosis not present

## 2021-04-30 DIAGNOSIS — J3089 Other allergic rhinitis: Secondary | ICD-10-CM | POA: Diagnosis not present

## 2021-04-30 DIAGNOSIS — J3081 Allergic rhinitis due to animal (cat) (dog) hair and dander: Secondary | ICD-10-CM | POA: Diagnosis not present

## 2021-05-06 DIAGNOSIS — F411 Generalized anxiety disorder: Secondary | ICD-10-CM | POA: Diagnosis not present

## 2021-05-06 DIAGNOSIS — H6983 Other specified disorders of Eustachian tube, bilateral: Secondary | ICD-10-CM | POA: Diagnosis not present

## 2021-05-07 DIAGNOSIS — J301 Allergic rhinitis due to pollen: Secondary | ICD-10-CM | POA: Diagnosis not present

## 2021-05-07 DIAGNOSIS — J3089 Other allergic rhinitis: Secondary | ICD-10-CM | POA: Diagnosis not present

## 2021-05-07 DIAGNOSIS — J3081 Allergic rhinitis due to animal (cat) (dog) hair and dander: Secondary | ICD-10-CM | POA: Diagnosis not present

## 2021-05-11 DIAGNOSIS — F4312 Post-traumatic stress disorder, chronic: Secondary | ICD-10-CM | POA: Diagnosis not present

## 2021-05-11 DIAGNOSIS — F411 Generalized anxiety disorder: Secondary | ICD-10-CM | POA: Diagnosis not present

## 2021-05-11 DIAGNOSIS — F331 Major depressive disorder, recurrent, moderate: Secondary | ICD-10-CM | POA: Diagnosis not present

## 2021-05-14 DIAGNOSIS — J301 Allergic rhinitis due to pollen: Secondary | ICD-10-CM | POA: Diagnosis not present

## 2021-05-14 DIAGNOSIS — J3089 Other allergic rhinitis: Secondary | ICD-10-CM | POA: Diagnosis not present

## 2021-05-14 DIAGNOSIS — J3081 Allergic rhinitis due to animal (cat) (dog) hair and dander: Secondary | ICD-10-CM | POA: Diagnosis not present

## 2021-05-20 DIAGNOSIS — F411 Generalized anxiety disorder: Secondary | ICD-10-CM | POA: Diagnosis not present

## 2021-05-21 DIAGNOSIS — J3081 Allergic rhinitis due to animal (cat) (dog) hair and dander: Secondary | ICD-10-CM | POA: Diagnosis not present

## 2021-05-21 DIAGNOSIS — J301 Allergic rhinitis due to pollen: Secondary | ICD-10-CM | POA: Diagnosis not present

## 2021-05-21 DIAGNOSIS — J3089 Other allergic rhinitis: Secondary | ICD-10-CM | POA: Diagnosis not present

## 2021-05-24 DIAGNOSIS — J301 Allergic rhinitis due to pollen: Secondary | ICD-10-CM | POA: Diagnosis not present

## 2021-05-24 DIAGNOSIS — J3081 Allergic rhinitis due to animal (cat) (dog) hair and dander: Secondary | ICD-10-CM | POA: Diagnosis not present

## 2021-05-25 DIAGNOSIS — J3089 Other allergic rhinitis: Secondary | ICD-10-CM | POA: Diagnosis not present

## 2021-05-31 DIAGNOSIS — E063 Autoimmune thyroiditis: Secondary | ICD-10-CM | POA: Diagnosis not present

## 2021-06-01 DIAGNOSIS — J3081 Allergic rhinitis due to animal (cat) (dog) hair and dander: Secondary | ICD-10-CM | POA: Diagnosis not present

## 2021-06-01 DIAGNOSIS — J301 Allergic rhinitis due to pollen: Secondary | ICD-10-CM | POA: Diagnosis not present

## 2021-06-01 DIAGNOSIS — J3089 Other allergic rhinitis: Secondary | ICD-10-CM | POA: Diagnosis not present

## 2021-06-03 DIAGNOSIS — F411 Generalized anxiety disorder: Secondary | ICD-10-CM | POA: Diagnosis not present

## 2021-06-08 DIAGNOSIS — J301 Allergic rhinitis due to pollen: Secondary | ICD-10-CM | POA: Diagnosis not present

## 2021-06-08 DIAGNOSIS — J3089 Other allergic rhinitis: Secondary | ICD-10-CM | POA: Diagnosis not present

## 2021-06-08 DIAGNOSIS — J3081 Allergic rhinitis due to animal (cat) (dog) hair and dander: Secondary | ICD-10-CM | POA: Diagnosis not present

## 2021-06-11 DIAGNOSIS — J3089 Other allergic rhinitis: Secondary | ICD-10-CM | POA: Diagnosis not present

## 2021-06-11 DIAGNOSIS — J3081 Allergic rhinitis due to animal (cat) (dog) hair and dander: Secondary | ICD-10-CM | POA: Diagnosis not present

## 2021-06-11 DIAGNOSIS — J301 Allergic rhinitis due to pollen: Secondary | ICD-10-CM | POA: Diagnosis not present

## 2021-06-16 DIAGNOSIS — F411 Generalized anxiety disorder: Secondary | ICD-10-CM | POA: Diagnosis not present

## 2021-06-16 DIAGNOSIS — J3081 Allergic rhinitis due to animal (cat) (dog) hair and dander: Secondary | ICD-10-CM | POA: Diagnosis not present

## 2021-06-16 DIAGNOSIS — J3089 Other allergic rhinitis: Secondary | ICD-10-CM | POA: Diagnosis not present

## 2021-06-16 DIAGNOSIS — J301 Allergic rhinitis due to pollen: Secondary | ICD-10-CM | POA: Diagnosis not present

## 2021-06-23 DIAGNOSIS — J3089 Other allergic rhinitis: Secondary | ICD-10-CM | POA: Diagnosis not present

## 2021-06-23 DIAGNOSIS — J3081 Allergic rhinitis due to animal (cat) (dog) hair and dander: Secondary | ICD-10-CM | POA: Diagnosis not present

## 2021-06-23 DIAGNOSIS — J301 Allergic rhinitis due to pollen: Secondary | ICD-10-CM | POA: Diagnosis not present

## 2021-06-28 DIAGNOSIS — J3081 Allergic rhinitis due to animal (cat) (dog) hair and dander: Secondary | ICD-10-CM | POA: Diagnosis not present

## 2021-06-28 DIAGNOSIS — J3089 Other allergic rhinitis: Secondary | ICD-10-CM | POA: Diagnosis not present

## 2021-06-28 DIAGNOSIS — J301 Allergic rhinitis due to pollen: Secondary | ICD-10-CM | POA: Diagnosis not present

## 2021-07-01 DIAGNOSIS — F411 Generalized anxiety disorder: Secondary | ICD-10-CM | POA: Diagnosis not present

## 2021-07-06 DIAGNOSIS — J301 Allergic rhinitis due to pollen: Secondary | ICD-10-CM | POA: Diagnosis not present

## 2021-07-06 DIAGNOSIS — J3081 Allergic rhinitis due to animal (cat) (dog) hair and dander: Secondary | ICD-10-CM | POA: Diagnosis not present

## 2021-07-06 DIAGNOSIS — J3089 Other allergic rhinitis: Secondary | ICD-10-CM | POA: Diagnosis not present

## 2021-07-12 DIAGNOSIS — J3081 Allergic rhinitis due to animal (cat) (dog) hair and dander: Secondary | ICD-10-CM | POA: Diagnosis not present

## 2021-07-12 DIAGNOSIS — J301 Allergic rhinitis due to pollen: Secondary | ICD-10-CM | POA: Diagnosis not present

## 2021-07-12 DIAGNOSIS — J3089 Other allergic rhinitis: Secondary | ICD-10-CM | POA: Diagnosis not present

## 2021-07-15 DIAGNOSIS — F411 Generalized anxiety disorder: Secondary | ICD-10-CM | POA: Diagnosis not present

## 2021-07-23 DIAGNOSIS — J3081 Allergic rhinitis due to animal (cat) (dog) hair and dander: Secondary | ICD-10-CM | POA: Diagnosis not present

## 2021-07-23 DIAGNOSIS — J3089 Other allergic rhinitis: Secondary | ICD-10-CM | POA: Diagnosis not present

## 2021-07-23 DIAGNOSIS — J301 Allergic rhinitis due to pollen: Secondary | ICD-10-CM | POA: Diagnosis not present

## 2021-07-28 DIAGNOSIS — J3081 Allergic rhinitis due to animal (cat) (dog) hair and dander: Secondary | ICD-10-CM | POA: Diagnosis not present

## 2021-07-28 DIAGNOSIS — F411 Generalized anxiety disorder: Secondary | ICD-10-CM | POA: Diagnosis not present

## 2021-07-28 DIAGNOSIS — J301 Allergic rhinitis due to pollen: Secondary | ICD-10-CM | POA: Diagnosis not present

## 2021-07-28 DIAGNOSIS — J3089 Other allergic rhinitis: Secondary | ICD-10-CM | POA: Diagnosis not present

## 2021-08-03 DIAGNOSIS — F411 Generalized anxiety disorder: Secondary | ICD-10-CM | POA: Diagnosis not present

## 2021-08-03 DIAGNOSIS — F331 Major depressive disorder, recurrent, moderate: Secondary | ICD-10-CM | POA: Diagnosis not present

## 2021-08-10 DIAGNOSIS — J301 Allergic rhinitis due to pollen: Secondary | ICD-10-CM | POA: Diagnosis not present

## 2021-08-10 DIAGNOSIS — J3089 Other allergic rhinitis: Secondary | ICD-10-CM | POA: Diagnosis not present

## 2021-08-10 DIAGNOSIS — J3081 Allergic rhinitis due to animal (cat) (dog) hair and dander: Secondary | ICD-10-CM | POA: Diagnosis not present

## 2021-08-12 DIAGNOSIS — F411 Generalized anxiety disorder: Secondary | ICD-10-CM | POA: Diagnosis not present

## 2021-08-18 DIAGNOSIS — J3089 Other allergic rhinitis: Secondary | ICD-10-CM | POA: Diagnosis not present

## 2021-08-18 DIAGNOSIS — J3081 Allergic rhinitis due to animal (cat) (dog) hair and dander: Secondary | ICD-10-CM | POA: Diagnosis not present

## 2021-08-18 DIAGNOSIS — J301 Allergic rhinitis due to pollen: Secondary | ICD-10-CM | POA: Diagnosis not present

## 2021-08-26 DIAGNOSIS — F411 Generalized anxiety disorder: Secondary | ICD-10-CM | POA: Diagnosis not present

## 2021-08-27 DIAGNOSIS — J301 Allergic rhinitis due to pollen: Secondary | ICD-10-CM | POA: Diagnosis not present

## 2021-08-27 DIAGNOSIS — J3089 Other allergic rhinitis: Secondary | ICD-10-CM | POA: Diagnosis not present

## 2021-08-27 DIAGNOSIS — J3081 Allergic rhinitis due to animal (cat) (dog) hair and dander: Secondary | ICD-10-CM | POA: Diagnosis not present

## 2021-09-03 DIAGNOSIS — J301 Allergic rhinitis due to pollen: Secondary | ICD-10-CM | POA: Diagnosis not present

## 2021-09-03 DIAGNOSIS — J3089 Other allergic rhinitis: Secondary | ICD-10-CM | POA: Diagnosis not present

## 2021-09-03 DIAGNOSIS — J3081 Allergic rhinitis due to animal (cat) (dog) hair and dander: Secondary | ICD-10-CM | POA: Diagnosis not present

## 2021-09-09 DIAGNOSIS — F411 Generalized anxiety disorder: Secondary | ICD-10-CM | POA: Diagnosis not present

## 2021-09-10 DIAGNOSIS — J3081 Allergic rhinitis due to animal (cat) (dog) hair and dander: Secondary | ICD-10-CM | POA: Diagnosis not present

## 2021-09-10 DIAGNOSIS — J301 Allergic rhinitis due to pollen: Secondary | ICD-10-CM | POA: Diagnosis not present

## 2021-09-10 DIAGNOSIS — J3089 Other allergic rhinitis: Secondary | ICD-10-CM | POA: Diagnosis not present

## 2021-09-17 DIAGNOSIS — J3081 Allergic rhinitis due to animal (cat) (dog) hair and dander: Secondary | ICD-10-CM | POA: Diagnosis not present

## 2021-09-17 DIAGNOSIS — J301 Allergic rhinitis due to pollen: Secondary | ICD-10-CM | POA: Diagnosis not present

## 2021-09-17 DIAGNOSIS — J3089 Other allergic rhinitis: Secondary | ICD-10-CM | POA: Diagnosis not present

## 2021-09-23 DIAGNOSIS — F411 Generalized anxiety disorder: Secondary | ICD-10-CM | POA: Diagnosis not present

## 2021-09-24 DIAGNOSIS — J3081 Allergic rhinitis due to animal (cat) (dog) hair and dander: Secondary | ICD-10-CM | POA: Diagnosis not present

## 2021-09-24 DIAGNOSIS — J3089 Other allergic rhinitis: Secondary | ICD-10-CM | POA: Diagnosis not present

## 2021-09-24 DIAGNOSIS — J301 Allergic rhinitis due to pollen: Secondary | ICD-10-CM | POA: Diagnosis not present

## 2021-10-08 DIAGNOSIS — J3089 Other allergic rhinitis: Secondary | ICD-10-CM | POA: Diagnosis not present

## 2021-10-08 DIAGNOSIS — J3081 Allergic rhinitis due to animal (cat) (dog) hair and dander: Secondary | ICD-10-CM | POA: Diagnosis not present

## 2021-10-08 DIAGNOSIS — J301 Allergic rhinitis due to pollen: Secondary | ICD-10-CM | POA: Diagnosis not present

## 2021-10-13 DIAGNOSIS — F411 Generalized anxiety disorder: Secondary | ICD-10-CM | POA: Diagnosis not present

## 2021-10-21 DIAGNOSIS — J301 Allergic rhinitis due to pollen: Secondary | ICD-10-CM | POA: Diagnosis not present

## 2021-10-21 DIAGNOSIS — J3081 Allergic rhinitis due to animal (cat) (dog) hair and dander: Secondary | ICD-10-CM | POA: Diagnosis not present

## 2021-10-22 DIAGNOSIS — J3089 Other allergic rhinitis: Secondary | ICD-10-CM | POA: Diagnosis not present

## 2021-10-26 DIAGNOSIS — J301 Allergic rhinitis due to pollen: Secondary | ICD-10-CM | POA: Diagnosis not present

## 2021-10-26 DIAGNOSIS — F411 Generalized anxiety disorder: Secondary | ICD-10-CM | POA: Diagnosis not present

## 2021-10-26 DIAGNOSIS — F331 Major depressive disorder, recurrent, moderate: Secondary | ICD-10-CM | POA: Diagnosis not present

## 2021-10-26 DIAGNOSIS — J3089 Other allergic rhinitis: Secondary | ICD-10-CM | POA: Diagnosis not present

## 2021-10-26 DIAGNOSIS — J3081 Allergic rhinitis due to animal (cat) (dog) hair and dander: Secondary | ICD-10-CM | POA: Diagnosis not present

## 2021-10-28 DIAGNOSIS — F411 Generalized anxiety disorder: Secondary | ICD-10-CM | POA: Diagnosis not present

## 2021-10-31 DIAGNOSIS — J209 Acute bronchitis, unspecified: Secondary | ICD-10-CM | POA: Diagnosis not present

## 2021-10-31 DIAGNOSIS — R051 Acute cough: Secondary | ICD-10-CM | POA: Diagnosis not present

## 2021-10-31 DIAGNOSIS — K648 Other hemorrhoids: Secondary | ICD-10-CM | POA: Diagnosis not present

## 2021-11-04 DIAGNOSIS — F411 Generalized anxiety disorder: Secondary | ICD-10-CM | POA: Diagnosis not present

## 2021-11-10 DIAGNOSIS — Z0000 Encounter for general adult medical examination without abnormal findings: Secondary | ICD-10-CM | POA: Diagnosis not present

## 2021-11-10 DIAGNOSIS — J4520 Mild intermittent asthma, uncomplicated: Secondary | ICD-10-CM | POA: Diagnosis not present

## 2021-11-10 DIAGNOSIS — E7800 Pure hypercholesterolemia, unspecified: Secondary | ICD-10-CM | POA: Diagnosis not present

## 2021-11-10 DIAGNOSIS — F419 Anxiety disorder, unspecified: Secondary | ICD-10-CM | POA: Diagnosis not present

## 2021-11-10 DIAGNOSIS — Z23 Encounter for immunization: Secondary | ICD-10-CM | POA: Diagnosis not present

## 2021-11-18 DIAGNOSIS — F411 Generalized anxiety disorder: Secondary | ICD-10-CM | POA: Diagnosis not present

## 2021-11-26 DIAGNOSIS — J301 Allergic rhinitis due to pollen: Secondary | ICD-10-CM | POA: Diagnosis not present

## 2021-11-26 DIAGNOSIS — J3089 Other allergic rhinitis: Secondary | ICD-10-CM | POA: Diagnosis not present

## 2021-11-26 DIAGNOSIS — J3081 Allergic rhinitis due to animal (cat) (dog) hair and dander: Secondary | ICD-10-CM | POA: Diagnosis not present

## 2021-11-30 DIAGNOSIS — J3089 Other allergic rhinitis: Secondary | ICD-10-CM | POA: Diagnosis not present

## 2021-11-30 DIAGNOSIS — J301 Allergic rhinitis due to pollen: Secondary | ICD-10-CM | POA: Diagnosis not present

## 2021-11-30 DIAGNOSIS — J3081 Allergic rhinitis due to animal (cat) (dog) hair and dander: Secondary | ICD-10-CM | POA: Diagnosis not present

## 2021-12-02 DIAGNOSIS — F411 Generalized anxiety disorder: Secondary | ICD-10-CM | POA: Diagnosis not present

## 2021-12-07 DIAGNOSIS — J3089 Other allergic rhinitis: Secondary | ICD-10-CM | POA: Diagnosis not present

## 2021-12-07 DIAGNOSIS — J3081 Allergic rhinitis due to animal (cat) (dog) hair and dander: Secondary | ICD-10-CM | POA: Diagnosis not present

## 2021-12-07 DIAGNOSIS — J301 Allergic rhinitis due to pollen: Secondary | ICD-10-CM | POA: Diagnosis not present

## 2021-12-14 DIAGNOSIS — F411 Generalized anxiety disorder: Secondary | ICD-10-CM | POA: Diagnosis not present

## 2021-12-16 DIAGNOSIS — J3081 Allergic rhinitis due to animal (cat) (dog) hair and dander: Secondary | ICD-10-CM | POA: Diagnosis not present

## 2021-12-16 DIAGNOSIS — J3089 Other allergic rhinitis: Secondary | ICD-10-CM | POA: Diagnosis not present

## 2021-12-16 DIAGNOSIS — J301 Allergic rhinitis due to pollen: Secondary | ICD-10-CM | POA: Diagnosis not present

## 2021-12-27 ENCOUNTER — Other Ambulatory Visit: Payer: Self-pay | Admitting: Obstetrics and Gynecology

## 2021-12-27 DIAGNOSIS — Z1231 Encounter for screening mammogram for malignant neoplasm of breast: Secondary | ICD-10-CM

## 2021-12-31 ENCOUNTER — Ambulatory Visit: Payer: BC Managed Care – PPO

## 2022-01-19 DIAGNOSIS — J3089 Other allergic rhinitis: Secondary | ICD-10-CM | POA: Diagnosis not present

## 2022-01-19 DIAGNOSIS — J3081 Allergic rhinitis due to animal (cat) (dog) hair and dander: Secondary | ICD-10-CM | POA: Diagnosis not present

## 2022-01-19 DIAGNOSIS — J301 Allergic rhinitis due to pollen: Secondary | ICD-10-CM | POA: Diagnosis not present

## 2022-02-02 DIAGNOSIS — J301 Allergic rhinitis due to pollen: Secondary | ICD-10-CM | POA: Diagnosis not present

## 2022-02-02 DIAGNOSIS — J3089 Other allergic rhinitis: Secondary | ICD-10-CM | POA: Diagnosis not present

## 2022-02-02 DIAGNOSIS — J3081 Allergic rhinitis due to animal (cat) (dog) hair and dander: Secondary | ICD-10-CM | POA: Diagnosis not present

## 2022-02-09 DIAGNOSIS — F411 Generalized anxiety disorder: Secondary | ICD-10-CM | POA: Diagnosis not present

## 2022-02-10 DIAGNOSIS — R1031 Right lower quadrant pain: Secondary | ICD-10-CM | POA: Diagnosis not present

## 2022-02-11 DIAGNOSIS — J3081 Allergic rhinitis due to animal (cat) (dog) hair and dander: Secondary | ICD-10-CM | POA: Diagnosis not present

## 2022-02-11 DIAGNOSIS — J301 Allergic rhinitis due to pollen: Secondary | ICD-10-CM | POA: Diagnosis not present

## 2022-02-11 DIAGNOSIS — J3089 Other allergic rhinitis: Secondary | ICD-10-CM | POA: Diagnosis not present

## 2022-02-21 ENCOUNTER — Ambulatory Visit
Admission: RE | Admit: 2022-02-21 | Discharge: 2022-02-21 | Disposition: A | Discharge status: Home / Self Care | Payer: BC Managed Care – PPO | Source: Ambulatory Visit | Attending: Obstetrics and Gynecology | Admitting: Obstetrics and Gynecology

## 2022-02-21 DIAGNOSIS — Z1231 Encounter for screening mammogram for malignant neoplasm of breast: Secondary | ICD-10-CM

## 2022-02-22 DIAGNOSIS — Z124 Encounter for screening for malignant neoplasm of cervix: Secondary | ICD-10-CM | POA: Diagnosis not present

## 2022-02-22 DIAGNOSIS — Z6823 Body mass index (BMI) 23.0-23.9, adult: Secondary | ICD-10-CM | POA: Diagnosis not present

## 2022-02-22 DIAGNOSIS — Z01419 Encounter for gynecological examination (general) (routine) without abnormal findings: Secondary | ICD-10-CM | POA: Diagnosis not present

## 2022-02-23 DIAGNOSIS — F411 Generalized anxiety disorder: Secondary | ICD-10-CM | POA: Diagnosis not present

## 2022-02-24 DIAGNOSIS — J3089 Other allergic rhinitis: Secondary | ICD-10-CM | POA: Diagnosis not present

## 2022-02-24 DIAGNOSIS — J301 Allergic rhinitis due to pollen: Secondary | ICD-10-CM | POA: Diagnosis not present

## 2022-02-24 DIAGNOSIS — J3081 Allergic rhinitis due to animal (cat) (dog) hair and dander: Secondary | ICD-10-CM | POA: Diagnosis not present

## 2022-03-02 DIAGNOSIS — J3081 Allergic rhinitis due to animal (cat) (dog) hair and dander: Secondary | ICD-10-CM | POA: Diagnosis not present

## 2022-03-02 DIAGNOSIS — J301 Allergic rhinitis due to pollen: Secondary | ICD-10-CM | POA: Diagnosis not present

## 2022-03-02 DIAGNOSIS — J3089 Other allergic rhinitis: Secondary | ICD-10-CM | POA: Diagnosis not present

## 2022-03-07 DIAGNOSIS — M9902 Segmental and somatic dysfunction of thoracic region: Secondary | ICD-10-CM | POA: Diagnosis not present

## 2022-03-07 DIAGNOSIS — M5137 Other intervertebral disc degeneration, lumbosacral region: Secondary | ICD-10-CM | POA: Diagnosis not present

## 2022-03-07 DIAGNOSIS — M9904 Segmental and somatic dysfunction of sacral region: Secondary | ICD-10-CM | POA: Diagnosis not present

## 2022-03-07 DIAGNOSIS — F411 Generalized anxiety disorder: Secondary | ICD-10-CM | POA: Diagnosis not present

## 2022-03-07 DIAGNOSIS — M9903 Segmental and somatic dysfunction of lumbar region: Secondary | ICD-10-CM | POA: Diagnosis not present

## 2022-03-14 DIAGNOSIS — M9904 Segmental and somatic dysfunction of sacral region: Secondary | ICD-10-CM | POA: Diagnosis not present

## 2022-03-14 DIAGNOSIS — M9902 Segmental and somatic dysfunction of thoracic region: Secondary | ICD-10-CM | POA: Diagnosis not present

## 2022-03-14 DIAGNOSIS — M9903 Segmental and somatic dysfunction of lumbar region: Secondary | ICD-10-CM | POA: Diagnosis not present

## 2022-03-14 DIAGNOSIS — M5137 Other intervertebral disc degeneration, lumbosacral region: Secondary | ICD-10-CM | POA: Diagnosis not present

## 2022-03-15 DIAGNOSIS — Z01818 Encounter for other preprocedural examination: Secondary | ICD-10-CM | POA: Diagnosis not present

## 2022-03-15 DIAGNOSIS — F331 Major depressive disorder, recurrent, moderate: Secondary | ICD-10-CM | POA: Diagnosis not present

## 2022-03-15 DIAGNOSIS — F411 Generalized anxiety disorder: Secondary | ICD-10-CM | POA: Diagnosis not present

## 2022-03-15 DIAGNOSIS — Z1211 Encounter for screening for malignant neoplasm of colon: Secondary | ICD-10-CM | POA: Diagnosis not present

## 2022-03-16 DIAGNOSIS — J301 Allergic rhinitis due to pollen: Secondary | ICD-10-CM | POA: Diagnosis not present

## 2022-03-16 DIAGNOSIS — J3081 Allergic rhinitis due to animal (cat) (dog) hair and dander: Secondary | ICD-10-CM | POA: Diagnosis not present

## 2022-03-16 DIAGNOSIS — J3089 Other allergic rhinitis: Secondary | ICD-10-CM | POA: Diagnosis not present

## 2022-03-17 DIAGNOSIS — M5137 Other intervertebral disc degeneration, lumbosacral region: Secondary | ICD-10-CM | POA: Diagnosis not present

## 2022-03-17 DIAGNOSIS — M9904 Segmental and somatic dysfunction of sacral region: Secondary | ICD-10-CM | POA: Diagnosis not present

## 2022-03-17 DIAGNOSIS — M9902 Segmental and somatic dysfunction of thoracic region: Secondary | ICD-10-CM | POA: Diagnosis not present

## 2022-03-17 DIAGNOSIS — M9903 Segmental and somatic dysfunction of lumbar region: Secondary | ICD-10-CM | POA: Diagnosis not present

## 2022-03-21 DIAGNOSIS — M9902 Segmental and somatic dysfunction of thoracic region: Secondary | ICD-10-CM | POA: Diagnosis not present

## 2022-03-21 DIAGNOSIS — M9904 Segmental and somatic dysfunction of sacral region: Secondary | ICD-10-CM | POA: Diagnosis not present

## 2022-03-21 DIAGNOSIS — M9903 Segmental and somatic dysfunction of lumbar region: Secondary | ICD-10-CM | POA: Diagnosis not present

## 2022-03-21 DIAGNOSIS — M5137 Other intervertebral disc degeneration, lumbosacral region: Secondary | ICD-10-CM | POA: Diagnosis not present

## 2022-03-23 DIAGNOSIS — M9903 Segmental and somatic dysfunction of lumbar region: Secondary | ICD-10-CM | POA: Diagnosis not present

## 2022-03-23 DIAGNOSIS — M9904 Segmental and somatic dysfunction of sacral region: Secondary | ICD-10-CM | POA: Diagnosis not present

## 2022-03-23 DIAGNOSIS — M9902 Segmental and somatic dysfunction of thoracic region: Secondary | ICD-10-CM | POA: Diagnosis not present

## 2022-03-23 DIAGNOSIS — M5137 Other intervertebral disc degeneration, lumbosacral region: Secondary | ICD-10-CM | POA: Diagnosis not present

## 2022-03-23 DIAGNOSIS — F411 Generalized anxiety disorder: Secondary | ICD-10-CM | POA: Diagnosis not present

## 2022-03-25 DIAGNOSIS — J301 Allergic rhinitis due to pollen: Secondary | ICD-10-CM | POA: Diagnosis not present

## 2022-03-25 DIAGNOSIS — J3081 Allergic rhinitis due to animal (cat) (dog) hair and dander: Secondary | ICD-10-CM | POA: Diagnosis not present

## 2022-03-25 DIAGNOSIS — J3089 Other allergic rhinitis: Secondary | ICD-10-CM | POA: Diagnosis not present

## 2022-04-04 DIAGNOSIS — M5137 Other intervertebral disc degeneration, lumbosacral region: Secondary | ICD-10-CM | POA: Diagnosis not present

## 2022-04-04 DIAGNOSIS — M9903 Segmental and somatic dysfunction of lumbar region: Secondary | ICD-10-CM | POA: Diagnosis not present

## 2022-04-04 DIAGNOSIS — M9904 Segmental and somatic dysfunction of sacral region: Secondary | ICD-10-CM | POA: Diagnosis not present

## 2022-04-04 DIAGNOSIS — M9902 Segmental and somatic dysfunction of thoracic region: Secondary | ICD-10-CM | POA: Diagnosis not present

## 2022-04-04 IMAGING — MG MM DIGITAL SCREENING BILAT W/ TOMO AND CAD
8 series · 8 of 24 positions shown · non-contrast
Comparison: Previous exam(s).

CLINICAL DATA: Screening.

EXAM:
DIGITAL SCREENING BILATERAL MAMMOGRAM WITH TOMOSYNTHESIS AND CAD
TECHNIQUE: Bilateral screening digital craniocaudal and mediolateral oblique
mammograms were obtained. Bilateral screening digital breast
tomosynthesis was performed. The images were evaluated with
computer-aided detection.

[R MLO synth-2D]
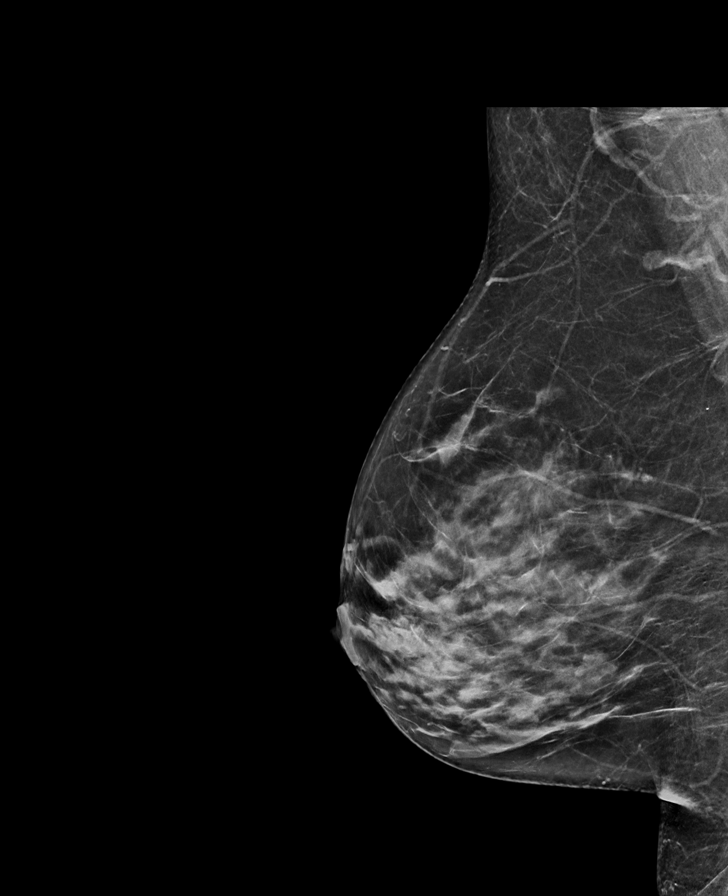

[R CC synth-2D]
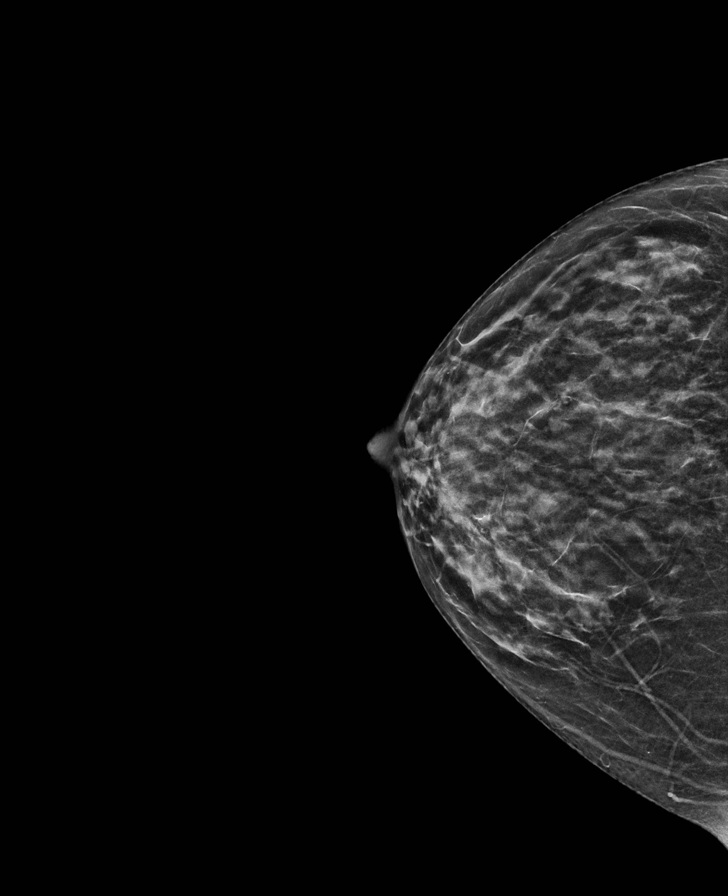

[L MLO synth-2D]
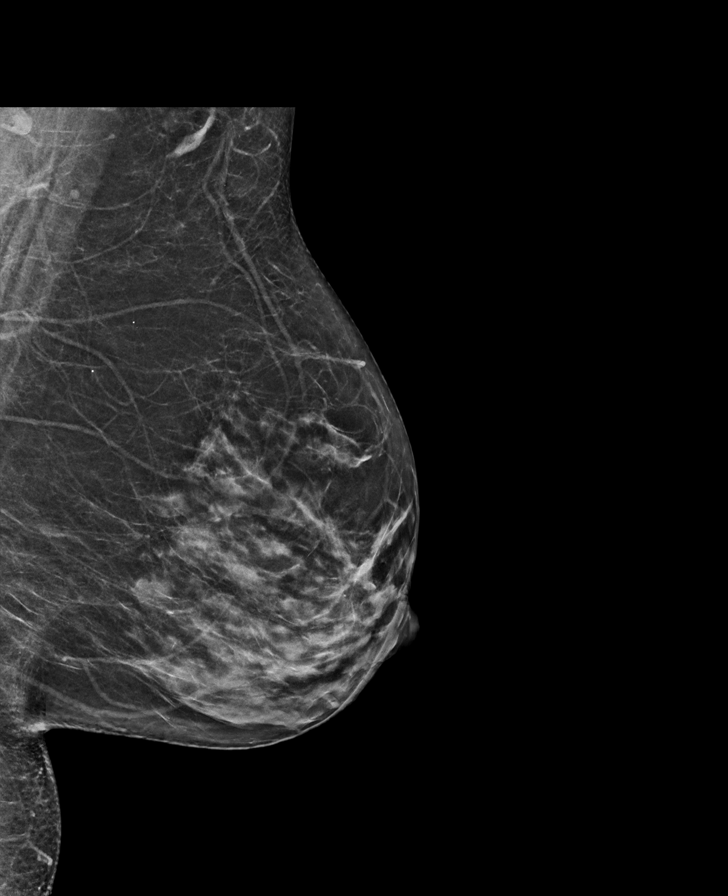

[L CC synth-2D]
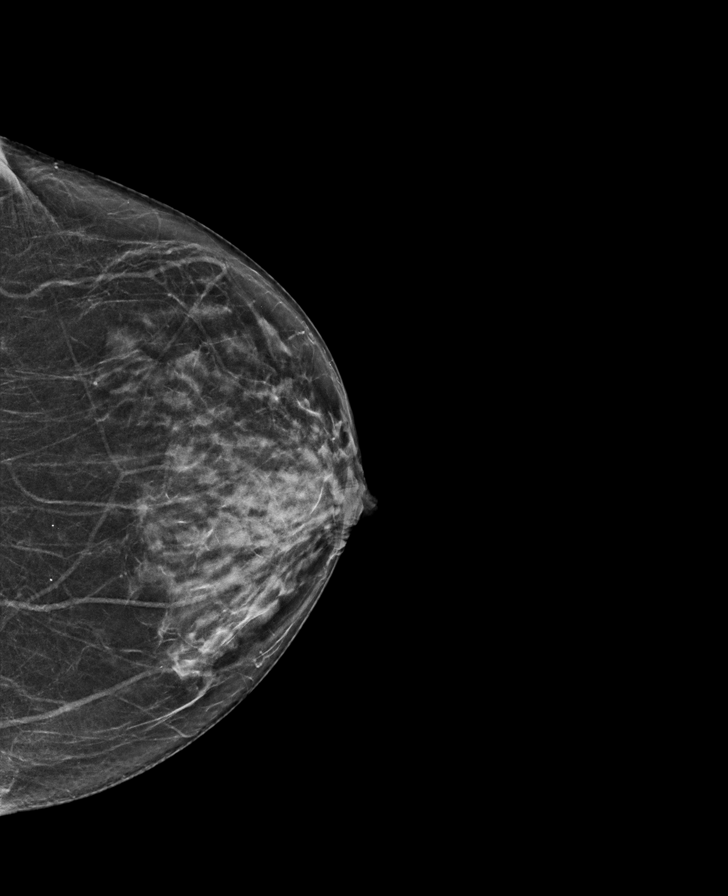

[R MLO tomo · tomo slice 35/69.0]
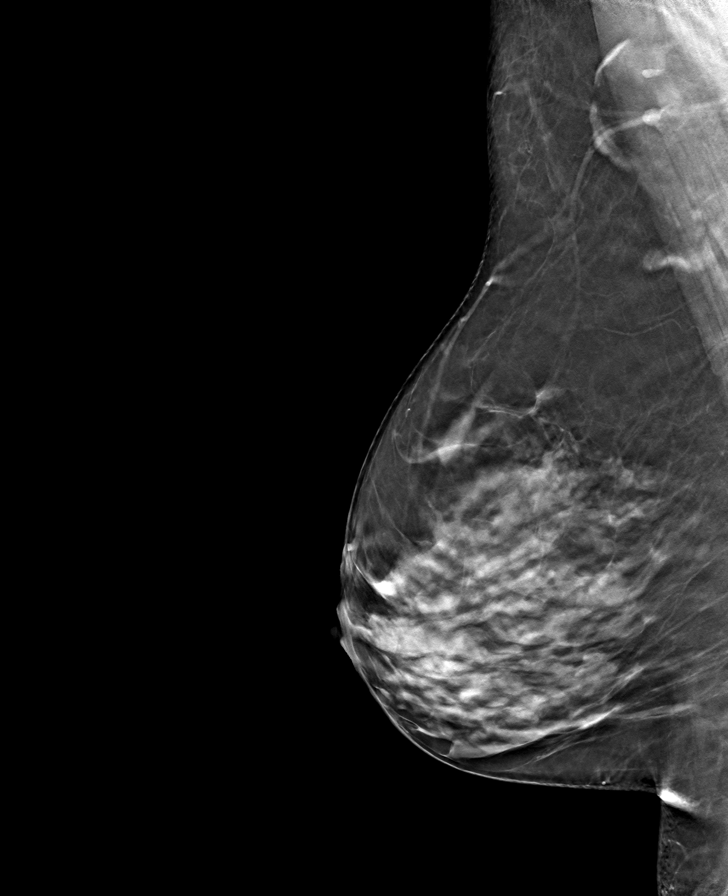

[L MLO tomo · tomo slice 32/63.0]
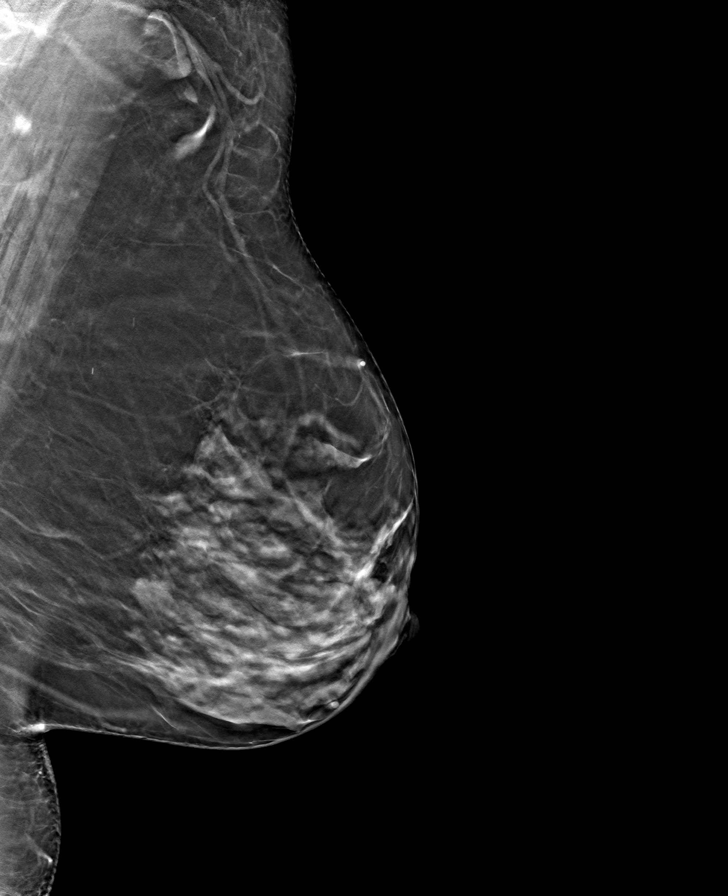

[L CC tomo · tomo slice 29/58.0]
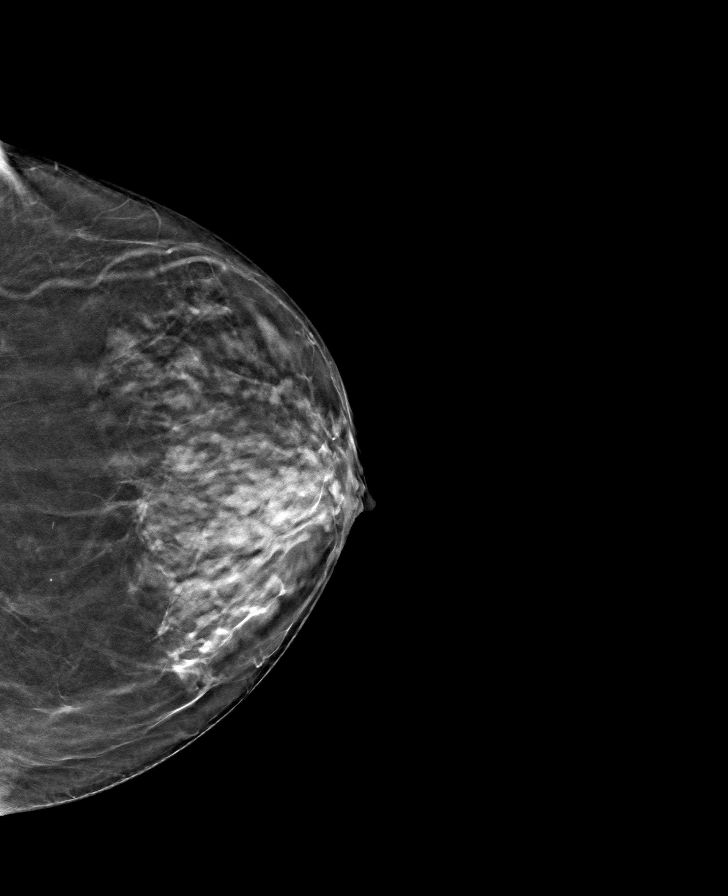

[R CC tomo · tomo slice 25/49.0]
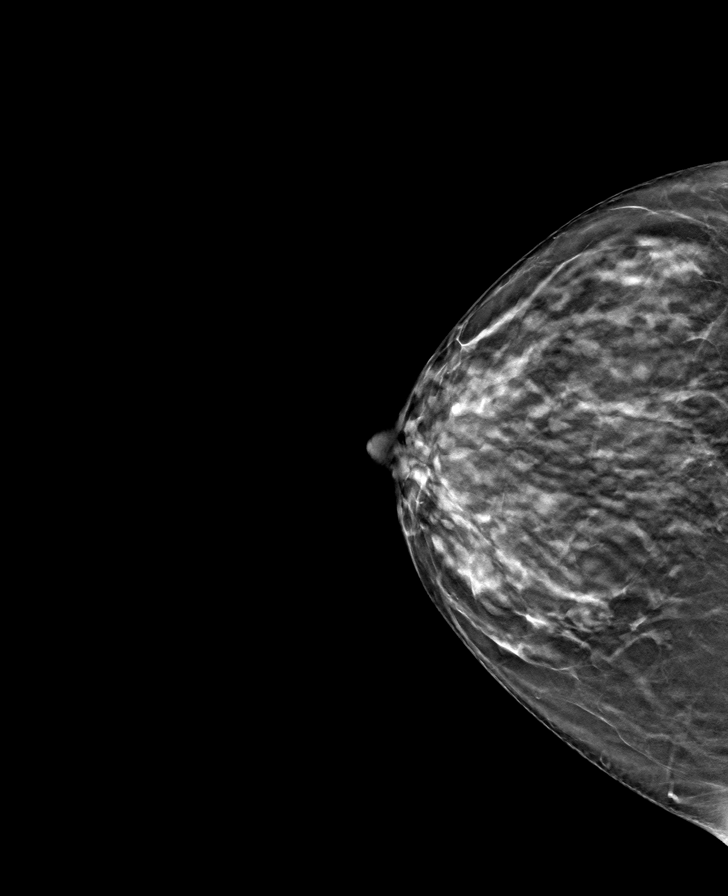

[8 of 24 positions shown; findings below may reference images not displayed]

ACR Breast Density Category c: The breast tissue is heterogeneously
dense, which may obscure small masses.
FINDINGS: There are no findings suspicious for malignancy.
IMPRESSION: No mammographic evidence of malignancy. A result letter of this
screening mammogram will be mailed directly to the patient.

RECOMMENDATION:
Screening mammogram in one year. (Code:Q3-W-BC3)

BI-RADS CATEGORY  1: Negative.

## 2022-04-05 DIAGNOSIS — J301 Allergic rhinitis due to pollen: Secondary | ICD-10-CM | POA: Diagnosis not present

## 2022-04-05 DIAGNOSIS — J3081 Allergic rhinitis due to animal (cat) (dog) hair and dander: Secondary | ICD-10-CM | POA: Diagnosis not present

## 2022-04-05 DIAGNOSIS — J3089 Other allergic rhinitis: Secondary | ICD-10-CM | POA: Diagnosis not present

## 2022-04-11 DIAGNOSIS — M9903 Segmental and somatic dysfunction of lumbar region: Secondary | ICD-10-CM | POA: Diagnosis not present

## 2022-04-11 DIAGNOSIS — J3089 Other allergic rhinitis: Secondary | ICD-10-CM | POA: Diagnosis not present

## 2022-04-11 DIAGNOSIS — J4540 Moderate persistent asthma, uncomplicated: Secondary | ICD-10-CM | POA: Diagnosis not present

## 2022-04-11 DIAGNOSIS — M9902 Segmental and somatic dysfunction of thoracic region: Secondary | ICD-10-CM | POA: Diagnosis not present

## 2022-04-11 DIAGNOSIS — M9904 Segmental and somatic dysfunction of sacral region: Secondary | ICD-10-CM | POA: Diagnosis not present

## 2022-04-11 DIAGNOSIS — J301 Allergic rhinitis due to pollen: Secondary | ICD-10-CM | POA: Diagnosis not present

## 2022-04-11 DIAGNOSIS — J3081 Allergic rhinitis due to animal (cat) (dog) hair and dander: Secondary | ICD-10-CM | POA: Diagnosis not present

## 2022-04-11 DIAGNOSIS — M5137 Other intervertebral disc degeneration, lumbosacral region: Secondary | ICD-10-CM | POA: Diagnosis not present

## 2022-04-13 DIAGNOSIS — F411 Generalized anxiety disorder: Secondary | ICD-10-CM | POA: Diagnosis not present

## 2022-04-13 DIAGNOSIS — E063 Autoimmune thyroiditis: Secondary | ICD-10-CM | POA: Diagnosis not present

## 2022-04-20 DIAGNOSIS — E063 Autoimmune thyroiditis: Secondary | ICD-10-CM | POA: Diagnosis not present

## 2022-04-20 DIAGNOSIS — J3089 Other allergic rhinitis: Secondary | ICD-10-CM | POA: Diagnosis not present

## 2022-04-20 DIAGNOSIS — J3081 Allergic rhinitis due to animal (cat) (dog) hair and dander: Secondary | ICD-10-CM | POA: Diagnosis not present

## 2022-04-20 DIAGNOSIS — J301 Allergic rhinitis due to pollen: Secondary | ICD-10-CM | POA: Diagnosis not present

## 2022-04-20 DIAGNOSIS — E039 Hypothyroidism, unspecified: Secondary | ICD-10-CM | POA: Diagnosis not present

## 2022-04-26 DIAGNOSIS — J3089 Other allergic rhinitis: Secondary | ICD-10-CM | POA: Diagnosis not present

## 2022-04-26 DIAGNOSIS — J301 Allergic rhinitis due to pollen: Secondary | ICD-10-CM | POA: Diagnosis not present

## 2022-04-26 DIAGNOSIS — J3081 Allergic rhinitis due to animal (cat) (dog) hair and dander: Secondary | ICD-10-CM | POA: Diagnosis not present

## 2022-04-27 DIAGNOSIS — F411 Generalized anxiety disorder: Secondary | ICD-10-CM | POA: Diagnosis not present

## 2022-05-03 DIAGNOSIS — J3089 Other allergic rhinitis: Secondary | ICD-10-CM | POA: Diagnosis not present

## 2022-05-03 DIAGNOSIS — J3081 Allergic rhinitis due to animal (cat) (dog) hair and dander: Secondary | ICD-10-CM | POA: Diagnosis not present

## 2022-05-03 DIAGNOSIS — J301 Allergic rhinitis due to pollen: Secondary | ICD-10-CM | POA: Diagnosis not present

## 2022-05-10 DIAGNOSIS — J301 Allergic rhinitis due to pollen: Secondary | ICD-10-CM | POA: Diagnosis not present

## 2022-05-10 DIAGNOSIS — J3089 Other allergic rhinitis: Secondary | ICD-10-CM | POA: Diagnosis not present

## 2022-05-10 DIAGNOSIS — J3081 Allergic rhinitis due to animal (cat) (dog) hair and dander: Secondary | ICD-10-CM | POA: Diagnosis not present

## 2022-05-11 DIAGNOSIS — F411 Generalized anxiety disorder: Secondary | ICD-10-CM | POA: Diagnosis not present

## 2022-05-18 DIAGNOSIS — J3081 Allergic rhinitis due to animal (cat) (dog) hair and dander: Secondary | ICD-10-CM | POA: Diagnosis not present

## 2022-05-18 DIAGNOSIS — J301 Allergic rhinitis due to pollen: Secondary | ICD-10-CM | POA: Diagnosis not present

## 2022-05-18 DIAGNOSIS — J3089 Other allergic rhinitis: Secondary | ICD-10-CM | POA: Diagnosis not present

## 2022-05-24 DIAGNOSIS — J301 Allergic rhinitis due to pollen: Secondary | ICD-10-CM | POA: Diagnosis not present

## 2022-05-24 DIAGNOSIS — J3089 Other allergic rhinitis: Secondary | ICD-10-CM | POA: Diagnosis not present

## 2022-05-24 DIAGNOSIS — J3081 Allergic rhinitis due to animal (cat) (dog) hair and dander: Secondary | ICD-10-CM | POA: Diagnosis not present

## 2022-05-25 DIAGNOSIS — F411 Generalized anxiety disorder: Secondary | ICD-10-CM | POA: Diagnosis not present

## 2022-05-31 DIAGNOSIS — J3081 Allergic rhinitis due to animal (cat) (dog) hair and dander: Secondary | ICD-10-CM | POA: Diagnosis not present

## 2022-05-31 DIAGNOSIS — J3089 Other allergic rhinitis: Secondary | ICD-10-CM | POA: Diagnosis not present

## 2022-05-31 DIAGNOSIS — J301 Allergic rhinitis due to pollen: Secondary | ICD-10-CM | POA: Diagnosis not present

## 2022-06-02 DIAGNOSIS — E063 Autoimmune thyroiditis: Secondary | ICD-10-CM | POA: Diagnosis not present

## 2022-06-02 DIAGNOSIS — E039 Hypothyroidism, unspecified: Secondary | ICD-10-CM | POA: Diagnosis not present

## 2022-06-08 DIAGNOSIS — F411 Generalized anxiety disorder: Secondary | ICD-10-CM | POA: Diagnosis not present

## 2022-06-10 DIAGNOSIS — J301 Allergic rhinitis due to pollen: Secondary | ICD-10-CM | POA: Diagnosis not present

## 2022-06-10 DIAGNOSIS — J3089 Other allergic rhinitis: Secondary | ICD-10-CM | POA: Diagnosis not present

## 2022-06-10 DIAGNOSIS — J3081 Allergic rhinitis due to animal (cat) (dog) hair and dander: Secondary | ICD-10-CM | POA: Diagnosis not present

## 2022-06-15 DIAGNOSIS — J3081 Allergic rhinitis due to animal (cat) (dog) hair and dander: Secondary | ICD-10-CM | POA: Diagnosis not present

## 2022-06-15 DIAGNOSIS — J3089 Other allergic rhinitis: Secondary | ICD-10-CM | POA: Diagnosis not present

## 2022-06-15 DIAGNOSIS — J301 Allergic rhinitis due to pollen: Secondary | ICD-10-CM | POA: Diagnosis not present

## 2022-06-22 DIAGNOSIS — F411 Generalized anxiety disorder: Secondary | ICD-10-CM | POA: Diagnosis not present

## 2022-06-23 DIAGNOSIS — J3081 Allergic rhinitis due to animal (cat) (dog) hair and dander: Secondary | ICD-10-CM | POA: Diagnosis not present

## 2022-06-23 DIAGNOSIS — J301 Allergic rhinitis due to pollen: Secondary | ICD-10-CM | POA: Diagnosis not present

## 2022-06-23 DIAGNOSIS — J3089 Other allergic rhinitis: Secondary | ICD-10-CM | POA: Diagnosis not present

## 2022-07-06 DIAGNOSIS — F411 Generalized anxiety disorder: Secondary | ICD-10-CM | POA: Diagnosis not present

## 2022-07-13 DIAGNOSIS — J3081 Allergic rhinitis due to animal (cat) (dog) hair and dander: Secondary | ICD-10-CM | POA: Diagnosis not present

## 2022-07-13 DIAGNOSIS — J3089 Other allergic rhinitis: Secondary | ICD-10-CM | POA: Diagnosis not present

## 2022-07-13 DIAGNOSIS — J301 Allergic rhinitis due to pollen: Secondary | ICD-10-CM | POA: Diagnosis not present

## 2022-07-20 DIAGNOSIS — F411 Generalized anxiety disorder: Secondary | ICD-10-CM | POA: Diagnosis not present

## 2022-07-27 ENCOUNTER — Ambulatory Visit: Payer: BC Managed Care – PPO | Admitting: Podiatry

## 2022-07-27 ENCOUNTER — Ambulatory Visit (INDEPENDENT_AMBULATORY_CARE_PROVIDER_SITE_OTHER): Payer: BC Managed Care – PPO

## 2022-07-27 DIAGNOSIS — S99921A Unspecified injury of right foot, initial encounter: Secondary | ICD-10-CM

## 2022-07-27 DIAGNOSIS — J3089 Other allergic rhinitis: Secondary | ICD-10-CM | POA: Diagnosis not present

## 2022-07-27 DIAGNOSIS — M25871 Other specified joint disorders, right ankle and foot: Secondary | ICD-10-CM

## 2022-07-27 DIAGNOSIS — J3081 Allergic rhinitis due to animal (cat) (dog) hair and dander: Secondary | ICD-10-CM | POA: Diagnosis not present

## 2022-07-27 DIAGNOSIS — J301 Allergic rhinitis due to pollen: Secondary | ICD-10-CM | POA: Diagnosis not present

## 2022-07-27 NOTE — Progress Notes (Signed)
Chief Complaint  Patient presents with   Toe Injury    Patient fell on Monday and injured right hallux. Sharp pain to the joint when applying pressure.     HPI: 46 y.o. female presents today with concern of pain near the right great toe joint.  Notes that pain is mostly medial and plantar.  There is pain when she pushes off the foot during propulsion phase.  She notes that she injured the foot when going up steps and fell backwards.  This occurred on 07/25/2022.  Walking and weightbearing aggravates her pain.  It feels better when she is off her foot.  Past Medical History:  Diagnosis Date   Asthma     Past Surgical History:  Procedure Laterality Date   ENDOMETRIAL ABLATION      Allergies  Allergen Reactions   Sulfa Antibiotics Hives    Physical Exam: There were no vitals filed for this visit.  General: The patient is alert and oriented x3 in no acute distress.  Dermatology: Skin is warm, dry and supple bilateral lower extremities. Interspaces are clear of maceration and debris.    Vascular: Palpable pedal pulses bilaterally. Capillary refill within normal limits.  Localized edema without ecchymosis to the right first metatarsophalangeal joint no erythema or calor.  Neurological: Light touch sensation grossly intact bilateral feet.   Musculoskeletal Exam: There is pain on palpation to the medial and plantar aspect of the first metatarsal phalangeal joint on the right foot.  There is no excessive pain on palpation of either sesamoid, mostly diffuse in nature in the area.  There is some pain with forced dorsiflexion of the metatarsophalangeal joint.  No crepitus is noted.  Radiographic Exam (right foot 3 weightbearing views 07/27/2022):  Normal osseous mineralization. Joint spaces preserved.  No fractures noted.  Slight increase in space between the tibial and fibular sesamoids plantar to the first metatarsophalangeal joint right foot.  Tibial sesamoid position is  4.  Assessment/Plan of Care: 1. Toe injury, right, initial encounter   2. Sesamoiditis of right foot    SURGICAL BOOT  Discussed clinical findings with patient today.  Patient was informed that her x-rays were negative for fracture.  Also informed her that there was slight widening of the distance between the sesamoid bones on the DP view.  This could be indicative of a possible tear of the anterior sesamoidal ligament.  There is still could be capsular injury noted but at this point in time I do not feel an MRI is indicated.  Recommend immobilization of the first metatarsophalangeal joint in a surgical shoe at all times weightbearing.  Also recommended the patient purchase a sesamoid relief sleeve on [employer] that she can also wear to decrease pressure on the sesamoids while wearing her surgical shoe.  She may also purchase a steel insole for her regular sneakers to transform these into a stiff soled shoe so that there is no movement at the metatarsophalangeal joint which will allow the soft tissues to heal.  Will reassess in 2 weeks and we will also rex-ray at that time to rule out any type of stress fracture which may have a delay on showing up on x-ray   Mervin Ramires DBurna Mortimer, DPM, FACFAS [facility]     [address]  [place], [place] [address]                Office [phone]  Fax [phone]

## 2022-08-03 DIAGNOSIS — J301 Allergic rhinitis due to pollen: Secondary | ICD-10-CM | POA: Diagnosis not present

## 2022-08-03 DIAGNOSIS — J3081 Allergic rhinitis due to animal (cat) (dog) hair and dander: Secondary | ICD-10-CM | POA: Diagnosis not present

## 2022-08-03 DIAGNOSIS — J3089 Other allergic rhinitis: Secondary | ICD-10-CM | POA: Diagnosis not present

## 2022-08-03 DIAGNOSIS — F411 Generalized anxiety disorder: Secondary | ICD-10-CM | POA: Diagnosis not present

## 2022-08-10 ENCOUNTER — Ambulatory Visit (INDEPENDENT_AMBULATORY_CARE_PROVIDER_SITE_OTHER): Payer: BC Managed Care – PPO

## 2022-08-10 ENCOUNTER — Ambulatory Visit: Payer: Self-pay | Admitting: Podiatry

## 2022-08-10 ENCOUNTER — Ambulatory Visit: Payer: BC Managed Care – PPO | Admitting: Podiatry

## 2022-08-10 DIAGNOSIS — S99921D Unspecified injury of right foot, subsequent encounter: Secondary | ICD-10-CM | POA: Diagnosis not present

## 2022-08-10 DIAGNOSIS — M25871 Other specified joint disorders, right ankle and foot: Secondary | ICD-10-CM

## 2022-08-10 MED ORDER — METHYLPREDNISOLONE 4 MG PO TBPK: ORAL_TABLET | ORAL | 0 refills | Status: AC

## 2022-08-10 NOTE — Progress Notes (Signed)
Chief Complaint  Patient presents with   Toe Injury    Pt states she is still having aching because she was using that foot a lot last week and she has to use it a lot yesterday when she was [place] her connections at the airport    HPI: 47 y.o. female presents today for follow-up of sesamoid pain to the right foot.  States that she has been wearing the sesamoid relief sleeve which has been pretty comfortable for her.  She notes that she was doing better however, she notes that she was walking a lot at the airport recently and feels like she flared up.  She has been wearing the surgical shoe.  Past Medical History:  Diagnosis Date   Asthma    Past Surgical History:  Procedure Laterality Date   ENDOMETRIAL ABLATION     Allergies  Allergen Reactions   Sulfa Antibiotics Hives    Physical Exam: There were no vitals filed for this visit.  General: The patient is alert and oriented x3 in no acute distress.  Dermatology: Skin is warm, dry and supple bilateral lower extremities. Interspaces are clear of maceration and debris.    Vascular: Palpable pedal pulses bilaterally. Capillary refill within normal limits.  No appreciable edema.  No erythema or calor.  Neurological: Light touch sensation grossly intact bilateral feet.   Musculoskeletal Exam: There is continued pain on palpation to the sesamoid apparatus on the right foot but there is less pain on exam than on her initial visit.  No surrounding erythema is noted.  Forced dorsiflexion of the hallux also aggravates the pain.  The flexor tendons just proximal to the sesamoid have pain on palpation.  Radiographic Exam (right foot 3 weightbearing views 08/10/2022):  Normal osseous mineralization.  On the DP view, there appears to be either a small accessory bone or bony fragment just anterior to the tibial sesamoid which was not seen on initial x-rays from 07/27/2022.  However, this fragment cannot be seen on any other view today.   Therefore cannot confirm fracture.  This could also be bone callus formation anteriorly if there was stress fracture/stress reaction to the tibial sesamoid.  Assessment/Plan of Care: 1. Injury of right foot, subsequent encounter   2. Sesamoiditis of right foot     Meds ordered this encounter  Medications   methylPREDNISolone (MEDROL DOSEPAK) 4 MG TBPK tablet    Sig: Take as directed    Dispense:  21 tablet    Refill:  0   DG FOOT COMPLETE RIGHT  Discussed clinical findings with patient today.  Since more pain was along the flexor tendon just proximal to the sesamoid bones offered to administer a corticosteroid injection to this area.  The patient noted she would like to avoid an injection if possible.  Therefore Medrol Dosepak was sent to her pharmacy.  She will take this as instructed.  She can continue with the sesamoid relief sleeve.  She would like to return to normal shoe gear.  Recommended that she purchase a thin full-length steel insole off of [employer] to place under her shoe insoles to make any shoes she has at home become a stiffer soled shoe so as not to flareup the sesamoid pain.  She will try this.  Follow-up in approximately 4 weeks.  If she is still symptomatic at this time and x-rays are still inconclusive for pathology, will order MRI.   Clerance Lav, DPM, FACFAS [facility]  [address] [place], [place] [address]                Office [phone]  Fax [phone]

## 2022-08-11 ENCOUNTER — Other Ambulatory Visit: Payer: Self-pay | Admitting: Podiatry

## 2022-08-11 DIAGNOSIS — S99921D Unspecified injury of right foot, subsequent encounter: Secondary | ICD-10-CM

## 2022-08-12 ENCOUNTER — Other Ambulatory Visit: Payer: Self-pay | Admitting: Podiatry

## 2022-08-12 DIAGNOSIS — J3089 Other allergic rhinitis: Secondary | ICD-10-CM | POA: Diagnosis not present

## 2022-08-12 DIAGNOSIS — J3081 Allergic rhinitis due to animal (cat) (dog) hair and dander: Secondary | ICD-10-CM | POA: Diagnosis not present

## 2022-08-12 DIAGNOSIS — J301 Allergic rhinitis due to pollen: Secondary | ICD-10-CM | POA: Diagnosis not present

## 2022-08-12 DIAGNOSIS — S99921D Unspecified injury of right foot, subsequent encounter: Secondary | ICD-10-CM

## 2022-08-15 ENCOUNTER — Encounter: Payer: Self-pay | Admitting: Podiatry

## 2022-08-16 DIAGNOSIS — J301 Allergic rhinitis due to pollen: Secondary | ICD-10-CM | POA: Diagnosis not present

## 2022-08-16 DIAGNOSIS — J3081 Allergic rhinitis due to animal (cat) (dog) hair and dander: Secondary | ICD-10-CM | POA: Diagnosis not present

## 2022-08-17 DIAGNOSIS — J3089 Other allergic rhinitis: Secondary | ICD-10-CM | POA: Diagnosis not present

## 2022-08-17 DIAGNOSIS — F411 Generalized anxiety disorder: Secondary | ICD-10-CM | POA: Diagnosis not present

## 2022-08-23 DIAGNOSIS — J3089 Other allergic rhinitis: Secondary | ICD-10-CM | POA: Diagnosis not present

## 2022-08-23 DIAGNOSIS — J3081 Allergic rhinitis due to animal (cat) (dog) hair and dander: Secondary | ICD-10-CM | POA: Diagnosis not present

## 2022-08-23 DIAGNOSIS — J301 Allergic rhinitis due to pollen: Secondary | ICD-10-CM | POA: Diagnosis not present

## 2022-08-31 DIAGNOSIS — F411 Generalized anxiety disorder: Secondary | ICD-10-CM | POA: Diagnosis not present

## 2022-09-06 DIAGNOSIS — F331 Major depressive disorder, recurrent, moderate: Secondary | ICD-10-CM | POA: Diagnosis not present

## 2022-09-06 DIAGNOSIS — F411 Generalized anxiety disorder: Secondary | ICD-10-CM | POA: Diagnosis not present

## 2022-09-07 ENCOUNTER — Ambulatory Visit (INDEPENDENT_AMBULATORY_CARE_PROVIDER_SITE_OTHER): Payer: BC Managed Care – PPO | Admitting: Podiatry

## 2022-09-07 DIAGNOSIS — J3081 Allergic rhinitis due to animal (cat) (dog) hair and dander: Secondary | ICD-10-CM | POA: Diagnosis not present

## 2022-09-07 DIAGNOSIS — M25871 Other specified joint disorders, right ankle and foot: Secondary | ICD-10-CM

## 2022-09-07 DIAGNOSIS — J3089 Other allergic rhinitis: Secondary | ICD-10-CM | POA: Diagnosis not present

## 2022-09-07 DIAGNOSIS — J301 Allergic rhinitis due to pollen: Secondary | ICD-10-CM | POA: Diagnosis not present

## 2022-09-07 NOTE — Progress Notes (Signed)
       Chief Complaint  Patient presents with   Follow-up    4 weeks for recheck right sesamoid pain. Patient is doing better. The pain has improved from last visit. She finished the steroid pack.    HPI: 47 y.o. female presents today for follow-up of right sesamoid pain at the first MPJ.  States that she is doing much better.  The gel elastic sleeve has provided improvement for her.    Past Medical History:  Diagnosis Date   Asthma     Past Surgical History:  Procedure Laterality Date   ENDOMETRIAL ABLATION      Allergies  Allergen Reactions   Sulfa Antibiotics Hives    Physical Exam: There is minimal to no pain on palpation of the sesamoids.  There is tightness of the medial central plantar fascial band and the long flexor tendons.  Reviewed golf ball massage to the area as well as using Voltaren gel to manually massage the area.  She was given some dancers pads to offload the area as needed.  Follow-up prn .  Assessment/Plan of Care: 1. Sesamoiditis of right foot     Reviewed golf ball massage to the area as well as using Voltaren gel to manually massage the area.  She was given some dancers pads to offload the area as needed.  Follow-up prn .   Clerance Lav, DPM, FACFAS [facility]     [address] [place], [place] [address]                Office [phone]  Fax [phone]

## 2022-09-14 DIAGNOSIS — F411 Generalized anxiety disorder: Secondary | ICD-10-CM | POA: Diagnosis not present

## 2022-09-14 DIAGNOSIS — J3089 Other allergic rhinitis: Secondary | ICD-10-CM | POA: Diagnosis not present

## 2022-09-14 DIAGNOSIS — J3081 Allergic rhinitis due to animal (cat) (dog) hair and dander: Secondary | ICD-10-CM | POA: Diagnosis not present

## 2022-09-14 DIAGNOSIS — J301 Allergic rhinitis due to pollen: Secondary | ICD-10-CM | POA: Diagnosis not present

## 2022-09-21 DIAGNOSIS — J3081 Allergic rhinitis due to animal (cat) (dog) hair and dander: Secondary | ICD-10-CM | POA: Diagnosis not present

## 2022-09-21 DIAGNOSIS — J301 Allergic rhinitis due to pollen: Secondary | ICD-10-CM | POA: Diagnosis not present

## 2022-09-21 DIAGNOSIS — J3089 Other allergic rhinitis: Secondary | ICD-10-CM | POA: Diagnosis not present

## 2022-09-22 DIAGNOSIS — N939 Abnormal uterine and vaginal bleeding, unspecified: Secondary | ICD-10-CM | POA: Diagnosis not present

## 2022-09-23 ENCOUNTER — Other Ambulatory Visit: Payer: Self-pay | Admitting: Obstetrics and Gynecology

## 2022-09-23 DIAGNOSIS — D259 Leiomyoma of uterus, unspecified: Secondary | ICD-10-CM

## 2022-09-28 DIAGNOSIS — F411 Generalized anxiety disorder: Secondary | ICD-10-CM | POA: Diagnosis not present

## 2022-10-12 DIAGNOSIS — F411 Generalized anxiety disorder: Secondary | ICD-10-CM | POA: Diagnosis not present

## 2022-10-13 DIAGNOSIS — J3089 Other allergic rhinitis: Secondary | ICD-10-CM | POA: Diagnosis not present

## 2022-10-13 DIAGNOSIS — J3081 Allergic rhinitis due to animal (cat) (dog) hair and dander: Secondary | ICD-10-CM | POA: Diagnosis not present

## 2022-10-13 DIAGNOSIS — J301 Allergic rhinitis due to pollen: Secondary | ICD-10-CM | POA: Diagnosis not present

## 2022-10-20 DIAGNOSIS — J3089 Other allergic rhinitis: Secondary | ICD-10-CM | POA: Diagnosis not present

## 2022-10-20 DIAGNOSIS — J3081 Allergic rhinitis due to animal (cat) (dog) hair and dander: Secondary | ICD-10-CM | POA: Diagnosis not present

## 2022-10-20 DIAGNOSIS — J301 Allergic rhinitis due to pollen: Secondary | ICD-10-CM | POA: Diagnosis not present

## 2022-10-26 DIAGNOSIS — F411 Generalized anxiety disorder: Secondary | ICD-10-CM | POA: Diagnosis not present

## 2022-11-01 DIAGNOSIS — J301 Allergic rhinitis due to pollen: Secondary | ICD-10-CM | POA: Diagnosis not present

## 2022-11-01 DIAGNOSIS — J3089 Other allergic rhinitis: Secondary | ICD-10-CM | POA: Diagnosis not present

## 2022-11-01 DIAGNOSIS — J3081 Allergic rhinitis due to animal (cat) (dog) hair and dander: Secondary | ICD-10-CM | POA: Diagnosis not present

## 2022-11-02 ENCOUNTER — Encounter: Payer: Self-pay | Admitting: Obstetrics and Gynecology

## 2022-11-05 ENCOUNTER — Ambulatory Visit
Admission: RE | Admit: 2022-11-05 | Discharge: 2022-11-05 | Disposition: A | Discharge status: Home / Self Care | Payer: BC Managed Care – PPO | Source: Ambulatory Visit | Attending: Obstetrics and Gynecology | Admitting: Obstetrics and Gynecology

## 2022-11-05 DIAGNOSIS — N83201 Unspecified ovarian cyst, right side: Secondary | ICD-10-CM | POA: Diagnosis not present

## 2022-11-05 DIAGNOSIS — D259 Leiomyoma of uterus, unspecified: Secondary | ICD-10-CM | POA: Diagnosis not present

## 2022-11-05 DIAGNOSIS — Q5128 Other doubling of uterus, other specified: Secondary | ICD-10-CM | POA: Diagnosis not present

## 2022-11-05 DIAGNOSIS — R102 Pelvic and perineal pain: Secondary | ICD-10-CM | POA: Diagnosis not present

## 2022-11-05 MED ORDER — GADOPICLENOL 0.5 MMOL/ML IV SOLN
6.0000 mL | Freq: Once | INTRAVENOUS | Status: AC | PRN
  Administered 2022-11-05: 6 mL via INTRAVENOUS

## 2022-11-09 DIAGNOSIS — F411 Generalized anxiety disorder: Secondary | ICD-10-CM | POA: Diagnosis not present

## 2022-11-10 DIAGNOSIS — R42 Dizziness and giddiness: Secondary | ICD-10-CM | POA: Diagnosis not present

## 2022-11-14 DIAGNOSIS — R42 Dizziness and giddiness: Secondary | ICD-10-CM | POA: Diagnosis not present

## 2022-11-16 DIAGNOSIS — J3081 Allergic rhinitis due to animal (cat) (dog) hair and dander: Secondary | ICD-10-CM | POA: Diagnosis not present

## 2022-11-16 DIAGNOSIS — J3089 Other allergic rhinitis: Secondary | ICD-10-CM | POA: Diagnosis not present

## 2022-11-16 DIAGNOSIS — J301 Allergic rhinitis due to pollen: Secondary | ICD-10-CM | POA: Diagnosis not present

## 2022-11-22 DIAGNOSIS — J301 Allergic rhinitis due to pollen: Secondary | ICD-10-CM | POA: Diagnosis not present

## 2022-11-22 DIAGNOSIS — J3081 Allergic rhinitis due to animal (cat) (dog) hair and dander: Secondary | ICD-10-CM | POA: Diagnosis not present

## 2022-11-22 DIAGNOSIS — J3089 Other allergic rhinitis: Secondary | ICD-10-CM | POA: Diagnosis not present

## 2022-11-23 DIAGNOSIS — F411 Generalized anxiety disorder: Secondary | ICD-10-CM | POA: Diagnosis not present

## 2022-11-29 DIAGNOSIS — Z13220 Encounter for screening for lipoid disorders: Secondary | ICD-10-CM | POA: Diagnosis not present

## 2022-11-29 DIAGNOSIS — E039 Hypothyroidism, unspecified: Secondary | ICD-10-CM | POA: Diagnosis not present

## 2022-11-29 DIAGNOSIS — Z0000 Encounter for general adult medical examination without abnormal findings: Secondary | ICD-10-CM | POA: Diagnosis not present

## 2022-11-29 DIAGNOSIS — J3081 Allergic rhinitis due to animal (cat) (dog) hair and dander: Secondary | ICD-10-CM | POA: Diagnosis not present

## 2022-11-29 DIAGNOSIS — J301 Allergic rhinitis due to pollen: Secondary | ICD-10-CM | POA: Diagnosis not present

## 2022-11-29 DIAGNOSIS — J3089 Other allergic rhinitis: Secondary | ICD-10-CM | POA: Diagnosis not present

## 2022-11-29 DIAGNOSIS — Z1159 Encounter for screening for other viral diseases: Secondary | ICD-10-CM | POA: Diagnosis not present

## 2022-12-06 DIAGNOSIS — J301 Allergic rhinitis due to pollen: Secondary | ICD-10-CM | POA: Diagnosis not present

## 2022-12-06 DIAGNOSIS — J3081 Allergic rhinitis due to animal (cat) (dog) hair and dander: Secondary | ICD-10-CM | POA: Diagnosis not present

## 2022-12-06 DIAGNOSIS — J3089 Other allergic rhinitis: Secondary | ICD-10-CM | POA: Diagnosis not present

## 2022-12-07 DIAGNOSIS — F411 Generalized anxiety disorder: Secondary | ICD-10-CM | POA: Diagnosis not present

## 2022-12-14 DIAGNOSIS — J301 Allergic rhinitis due to pollen: Secondary | ICD-10-CM | POA: Diagnosis not present

## 2022-12-14 DIAGNOSIS — J3081 Allergic rhinitis due to animal (cat) (dog) hair and dander: Secondary | ICD-10-CM | POA: Diagnosis not present

## 2022-12-14 DIAGNOSIS — J3089 Other allergic rhinitis: Secondary | ICD-10-CM | POA: Diagnosis not present

## 2022-12-28 DIAGNOSIS — J3081 Allergic rhinitis due to animal (cat) (dog) hair and dander: Secondary | ICD-10-CM | POA: Diagnosis not present

## 2022-12-28 DIAGNOSIS — J3089 Other allergic rhinitis: Secondary | ICD-10-CM | POA: Diagnosis not present

## 2022-12-28 DIAGNOSIS — J301 Allergic rhinitis due to pollen: Secondary | ICD-10-CM | POA: Diagnosis not present

## 2023-01-04 DIAGNOSIS — F411 Generalized anxiety disorder: Secondary | ICD-10-CM | POA: Diagnosis not present

## 2023-01-23 DIAGNOSIS — J45901 Unspecified asthma with (acute) exacerbation: Secondary | ICD-10-CM | POA: Diagnosis not present

## 2023-01-23 DIAGNOSIS — R051 Acute cough: Secondary | ICD-10-CM | POA: Diagnosis not present

## 2023-01-23 DIAGNOSIS — F411 Generalized anxiety disorder: Secondary | ICD-10-CM | POA: Diagnosis not present

## 2023-02-01 DIAGNOSIS — J3081 Allergic rhinitis due to animal (cat) (dog) hair and dander: Secondary | ICD-10-CM | POA: Diagnosis not present

## 2023-02-01 DIAGNOSIS — J301 Allergic rhinitis due to pollen: Secondary | ICD-10-CM | POA: Diagnosis not present

## 2023-02-01 DIAGNOSIS — J3089 Other allergic rhinitis: Secondary | ICD-10-CM | POA: Diagnosis not present

## 2023-02-01 DIAGNOSIS — F411 Generalized anxiety disorder: Secondary | ICD-10-CM | POA: Diagnosis not present

## 2023-02-08 DIAGNOSIS — J3081 Allergic rhinitis due to animal (cat) (dog) hair and dander: Secondary | ICD-10-CM | POA: Diagnosis not present

## 2023-02-08 DIAGNOSIS — J301 Allergic rhinitis due to pollen: Secondary | ICD-10-CM | POA: Diagnosis not present

## 2023-02-08 DIAGNOSIS — J3089 Other allergic rhinitis: Secondary | ICD-10-CM | POA: Diagnosis not present

## 2023-02-15 DIAGNOSIS — F411 Generalized anxiety disorder: Secondary | ICD-10-CM | POA: Diagnosis not present

## 2023-02-16 DIAGNOSIS — W5501XA Bitten by cat, initial encounter: Secondary | ICD-10-CM | POA: Diagnosis not present

## 2023-02-16 DIAGNOSIS — S61211A Laceration without foreign body of left index finger without damage to nail, initial encounter: Secondary | ICD-10-CM | POA: Diagnosis not present

## 2023-02-28 DIAGNOSIS — F411 Generalized anxiety disorder: Secondary | ICD-10-CM | POA: Diagnosis not present

## 2023-02-28 DIAGNOSIS — F331 Major depressive disorder, recurrent, moderate: Secondary | ICD-10-CM | POA: Diagnosis not present

## 2023-03-01 DIAGNOSIS — F411 Generalized anxiety disorder: Secondary | ICD-10-CM | POA: Diagnosis not present

## 2023-03-07 ENCOUNTER — Other Ambulatory Visit: Payer: Self-pay | Admitting: Obstetrics and Gynecology

## 2023-03-07 DIAGNOSIS — J301 Allergic rhinitis due to pollen: Secondary | ICD-10-CM | POA: Diagnosis not present

## 2023-03-07 DIAGNOSIS — Z1231 Encounter for screening mammogram for malignant neoplasm of breast: Secondary | ICD-10-CM

## 2023-03-07 DIAGNOSIS — J3089 Other allergic rhinitis: Secondary | ICD-10-CM | POA: Diagnosis not present

## 2023-03-07 DIAGNOSIS — J3081 Allergic rhinitis due to animal (cat) (dog) hair and dander: Secondary | ICD-10-CM | POA: Diagnosis not present

## 2023-03-10 ENCOUNTER — Ambulatory Visit
Admission: RE | Admit: 2023-03-10 | Discharge: 2023-03-10 | Disposition: A | Discharge status: Home / Self Care | Payer: BC Managed Care – PPO | Source: Ambulatory Visit | Attending: Obstetrics and Gynecology | Admitting: Obstetrics and Gynecology

## 2023-03-10 DIAGNOSIS — Z1231 Encounter for screening mammogram for malignant neoplasm of breast: Secondary | ICD-10-CM

## 2023-03-14 DIAGNOSIS — F411 Generalized anxiety disorder: Secondary | ICD-10-CM | POA: Diagnosis not present

## 2023-03-29 DIAGNOSIS — F411 Generalized anxiety disorder: Secondary | ICD-10-CM | POA: Diagnosis not present

## 2023-03-31 DIAGNOSIS — J4540 Moderate persistent asthma, uncomplicated: Secondary | ICD-10-CM | POA: Diagnosis not present

## 2023-03-31 DIAGNOSIS — J45998 Other asthma: Secondary | ICD-10-CM | POA: Diagnosis not present

## 2023-03-31 DIAGNOSIS — J3089 Other allergic rhinitis: Secondary | ICD-10-CM | POA: Diagnosis not present

## 2023-03-31 DIAGNOSIS — J3081 Allergic rhinitis due to animal (cat) (dog) hair and dander: Secondary | ICD-10-CM | POA: Diagnosis not present

## 2023-03-31 DIAGNOSIS — J301 Allergic rhinitis due to pollen: Secondary | ICD-10-CM | POA: Diagnosis not present

## 2023-04-05 DIAGNOSIS — J3089 Other allergic rhinitis: Secondary | ICD-10-CM | POA: Diagnosis not present

## 2023-04-05 DIAGNOSIS — J3081 Allergic rhinitis due to animal (cat) (dog) hair and dander: Secondary | ICD-10-CM | POA: Diagnosis not present

## 2023-04-05 DIAGNOSIS — J301 Allergic rhinitis due to pollen: Secondary | ICD-10-CM | POA: Diagnosis not present

## 2023-04-12 DIAGNOSIS — J3089 Other allergic rhinitis: Secondary | ICD-10-CM | POA: Diagnosis not present

## 2023-04-12 DIAGNOSIS — J301 Allergic rhinitis due to pollen: Secondary | ICD-10-CM | POA: Diagnosis not present

## 2023-04-12 DIAGNOSIS — J3081 Allergic rhinitis due to animal (cat) (dog) hair and dander: Secondary | ICD-10-CM | POA: Diagnosis not present

## 2023-04-19 DIAGNOSIS — J3089 Other allergic rhinitis: Secondary | ICD-10-CM | POA: Diagnosis not present

## 2023-04-19 DIAGNOSIS — J301 Allergic rhinitis due to pollen: Secondary | ICD-10-CM | POA: Diagnosis not present

## 2023-04-19 DIAGNOSIS — J3081 Allergic rhinitis due to animal (cat) (dog) hair and dander: Secondary | ICD-10-CM | POA: Diagnosis not present

## 2023-04-25 DIAGNOSIS — J301 Allergic rhinitis due to pollen: Secondary | ICD-10-CM | POA: Diagnosis not present

## 2023-04-25 DIAGNOSIS — J3081 Allergic rhinitis due to animal (cat) (dog) hair and dander: Secondary | ICD-10-CM | POA: Diagnosis not present

## 2023-04-25 DIAGNOSIS — J3089 Other allergic rhinitis: Secondary | ICD-10-CM | POA: Diagnosis not present

## 2023-04-27 DIAGNOSIS — E039 Hypothyroidism, unspecified: Secondary | ICD-10-CM | POA: Diagnosis not present

## 2023-05-02 DIAGNOSIS — J3081 Allergic rhinitis due to animal (cat) (dog) hair and dander: Secondary | ICD-10-CM | POA: Diagnosis not present

## 2023-05-02 DIAGNOSIS — J301 Allergic rhinitis due to pollen: Secondary | ICD-10-CM | POA: Diagnosis not present

## 2023-05-02 DIAGNOSIS — J3089 Other allergic rhinitis: Secondary | ICD-10-CM | POA: Diagnosis not present

## 2023-05-17 DIAGNOSIS — J3081 Allergic rhinitis due to animal (cat) (dog) hair and dander: Secondary | ICD-10-CM | POA: Diagnosis not present

## 2023-05-17 DIAGNOSIS — J301 Allergic rhinitis due to pollen: Secondary | ICD-10-CM | POA: Diagnosis not present

## 2023-05-17 DIAGNOSIS — J3089 Other allergic rhinitis: Secondary | ICD-10-CM | POA: Diagnosis not present

## 2023-05-24 DIAGNOSIS — J3089 Other allergic rhinitis: Secondary | ICD-10-CM | POA: Diagnosis not present

## 2023-05-24 DIAGNOSIS — J3081 Allergic rhinitis due to animal (cat) (dog) hair and dander: Secondary | ICD-10-CM | POA: Diagnosis not present

## 2023-05-24 DIAGNOSIS — J301 Allergic rhinitis due to pollen: Secondary | ICD-10-CM | POA: Diagnosis not present

## 2023-05-31 DIAGNOSIS — J3089 Other allergic rhinitis: Secondary | ICD-10-CM | POA: Diagnosis not present

## 2023-05-31 DIAGNOSIS — J301 Allergic rhinitis due to pollen: Secondary | ICD-10-CM | POA: Diagnosis not present

## 2023-05-31 DIAGNOSIS — J3081 Allergic rhinitis due to animal (cat) (dog) hair and dander: Secondary | ICD-10-CM | POA: Diagnosis not present

## 2023-06-02 DIAGNOSIS — F430 Acute stress reaction: Secondary | ICD-10-CM | POA: Diagnosis not present

## 2023-06-07 DIAGNOSIS — J301 Allergic rhinitis due to pollen: Secondary | ICD-10-CM | POA: Diagnosis not present

## 2023-06-07 DIAGNOSIS — J3081 Allergic rhinitis due to animal (cat) (dog) hair and dander: Secondary | ICD-10-CM | POA: Diagnosis not present

## 2023-06-07 DIAGNOSIS — J3089 Other allergic rhinitis: Secondary | ICD-10-CM | POA: Diagnosis not present

## 2023-06-09 DIAGNOSIS — F430 Acute stress reaction: Secondary | ICD-10-CM | POA: Diagnosis not present

## 2023-06-14 DIAGNOSIS — J301 Allergic rhinitis due to pollen: Secondary | ICD-10-CM | POA: Diagnosis not present

## 2023-06-14 DIAGNOSIS — J3089 Other allergic rhinitis: Secondary | ICD-10-CM | POA: Diagnosis not present

## 2023-06-14 DIAGNOSIS — J3081 Allergic rhinitis due to animal (cat) (dog) hair and dander: Secondary | ICD-10-CM | POA: Diagnosis not present

## 2023-06-16 DIAGNOSIS — F430 Acute stress reaction: Secondary | ICD-10-CM | POA: Diagnosis not present

## 2023-06-21 DIAGNOSIS — J301 Allergic rhinitis due to pollen: Secondary | ICD-10-CM | POA: Diagnosis not present

## 2023-06-21 DIAGNOSIS — J3089 Other allergic rhinitis: Secondary | ICD-10-CM | POA: Diagnosis not present

## 2023-06-21 DIAGNOSIS — J3081 Allergic rhinitis due to animal (cat) (dog) hair and dander: Secondary | ICD-10-CM | POA: Diagnosis not present

## 2023-06-28 DIAGNOSIS — L905 Scar conditions and fibrosis of skin: Secondary | ICD-10-CM | POA: Diagnosis not present

## 2023-06-28 DIAGNOSIS — L814 Other melanin hyperpigmentation: Secondary | ICD-10-CM | POA: Diagnosis not present

## 2023-06-28 DIAGNOSIS — L821 Other seborrheic keratosis: Secondary | ICD-10-CM | POA: Diagnosis not present

## 2023-06-28 DIAGNOSIS — D225 Melanocytic nevi of trunk: Secondary | ICD-10-CM | POA: Diagnosis not present

## 2023-07-06 DIAGNOSIS — F430 Acute stress reaction: Secondary | ICD-10-CM | POA: Diagnosis not present

## 2023-07-12 DIAGNOSIS — J301 Allergic rhinitis due to pollen: Secondary | ICD-10-CM | POA: Diagnosis not present

## 2023-07-12 DIAGNOSIS — J3089 Other allergic rhinitis: Secondary | ICD-10-CM | POA: Diagnosis not present

## 2023-07-12 DIAGNOSIS — J3081 Allergic rhinitis due to animal (cat) (dog) hair and dander: Secondary | ICD-10-CM | POA: Diagnosis not present

## 2023-07-19 DIAGNOSIS — J3081 Allergic rhinitis due to animal (cat) (dog) hair and dander: Secondary | ICD-10-CM | POA: Diagnosis not present

## 2023-07-19 DIAGNOSIS — J3089 Other allergic rhinitis: Secondary | ICD-10-CM | POA: Diagnosis not present

## 2023-07-19 DIAGNOSIS — J301 Allergic rhinitis due to pollen: Secondary | ICD-10-CM | POA: Diagnosis not present

## 2023-08-02 DIAGNOSIS — J3089 Other allergic rhinitis: Secondary | ICD-10-CM | POA: Diagnosis not present

## 2023-08-02 DIAGNOSIS — J3081 Allergic rhinitis due to animal (cat) (dog) hair and dander: Secondary | ICD-10-CM | POA: Diagnosis not present

## 2023-08-02 DIAGNOSIS — J301 Allergic rhinitis due to pollen: Secondary | ICD-10-CM | POA: Diagnosis not present

## 2023-08-09 DIAGNOSIS — J301 Allergic rhinitis due to pollen: Secondary | ICD-10-CM | POA: Diagnosis not present

## 2023-08-09 DIAGNOSIS — J3089 Other allergic rhinitis: Secondary | ICD-10-CM | POA: Diagnosis not present

## 2023-08-09 DIAGNOSIS — J3081 Allergic rhinitis due to animal (cat) (dog) hair and dander: Secondary | ICD-10-CM | POA: Diagnosis not present

## 2023-08-14 DIAGNOSIS — F411 Generalized anxiety disorder: Secondary | ICD-10-CM | POA: Diagnosis not present

## 2023-08-30 DIAGNOSIS — J3089 Other allergic rhinitis: Secondary | ICD-10-CM | POA: Diagnosis not present

## 2023-08-30 DIAGNOSIS — J301 Allergic rhinitis due to pollen: Secondary | ICD-10-CM | POA: Diagnosis not present

## 2023-08-30 DIAGNOSIS — J3081 Allergic rhinitis due to animal (cat) (dog) hair and dander: Secondary | ICD-10-CM | POA: Diagnosis not present

## 2023-09-04 DIAGNOSIS — F411 Generalized anxiety disorder: Secondary | ICD-10-CM | POA: Diagnosis not present

## 2023-09-12 DIAGNOSIS — J301 Allergic rhinitis due to pollen: Secondary | ICD-10-CM | POA: Diagnosis not present

## 2023-09-12 DIAGNOSIS — J3081 Allergic rhinitis due to animal (cat) (dog) hair and dander: Secondary | ICD-10-CM | POA: Diagnosis not present

## 2023-09-13 ENCOUNTER — Ambulatory Visit: Admitting: Orthopaedic Surgery

## 2023-09-13 ENCOUNTER — Other Ambulatory Visit (INDEPENDENT_AMBULATORY_CARE_PROVIDER_SITE_OTHER): Payer: Self-pay

## 2023-09-13 DIAGNOSIS — M79641 Pain in right hand: Secondary | ICD-10-CM | POA: Diagnosis not present

## 2023-09-13 DIAGNOSIS — J301 Allergic rhinitis due to pollen: Secondary | ICD-10-CM | POA: Diagnosis not present

## 2023-09-13 DIAGNOSIS — J3089 Other allergic rhinitis: Secondary | ICD-10-CM | POA: Diagnosis not present

## 2023-09-13 DIAGNOSIS — J3081 Allergic rhinitis due to animal (cat) (dog) hair and dander: Secondary | ICD-10-CM | POA: Diagnosis not present

## 2023-09-13 NOTE — Progress Notes (Signed)
 Office Visit Note   Patient: Julia Randall           Date of Birth: [birth year]           MRN: [id number] Visit Date: 09/13/2023              Requested by: Claudene Pellet, MD [id number] [address] [place],  [place] [address] PCP: Claudene Pellet, MD   Assessment & Plan: Visit Diagnoses:  1. Pain in right hand     Plan: History of Present Illness Julia Randall is a 48 year old female who presents with shooting pains and numbness in her right hand and arm.  She has experienced shooting pains and numbness in her right hand and arm for six months. Numbness primarily affects the pinky and ring fingers, with pain radiating up the forearm into the biceps. She sometimes experiences weakness in grip strength. Her arms fall asleep when lying on either side, a symptom noted three years ago, leading to a neck x-ray. No nerve conduction study has been performed.  Physical Exam MUSCULOSKELETAL: Ulnar nerve subluxation at elbow. Hand muscle function normal. No intrinsic muscle atrophy.  Slight decreased sensation in small and ring fingers.  Results RADIOLOGY Hand X-ray: Normal  Assessment and Plan Right ulnar nerve subluxation Ulnar nerve subluxation causing sensory symptoms without motor involvement. X-rays normal. - Advised against resting elbow on surfaces to avoid ulnar nerve pressure. - Instructed to sleep with elbows straight to reduce nerve tension. - Consider surgical intervention if symptoms worsen.  Follow-Up Instructions: No follow-ups on file.   Orders:  Orders Placed This Encounter  Procedures   XR Hand Complete Right   No orders of the defined types were placed in this encounter.  Subjective: Chief Complaint  Patient presents with   Right Hand - Pain    HPI  Review of Systems  Constitutional: Negative.   HENT: Negative.    Eyes: Negative.   Respiratory: Negative.    Cardiovascular: Negative.   Endocrine: Negative.    Musculoskeletal: Negative.   Neurological: Negative.   Hematological: Negative.   Psychiatric/Behavioral: Negative.    All other systems reviewed and are negative.    Objective: Vital Signs: There were no vitals taken for this visit.  Physical Exam Vitals and nursing note reviewed.  Constitutional:      Appearance: She is well-developed.  HENT:     Head: Atraumatic.     Nose: Nose normal.  Eyes:     Extraocular Movements: Extraocular movements intact.  Cardiovascular:     Pulses: Normal pulses.  Pulmonary:     Effort: Pulmonary effort is normal.  Abdominal:     Palpations: Abdomen is soft.  Musculoskeletal:     Cervical back: Neck supple.  Skin:    General: Skin is warm.     Capillary Refill: Capillary refill takes less than 2 seconds.  Neurological:     Mental Status: She is alert. Mental status is at baseline.  Psychiatric:        Behavior: Behavior normal.        Thought Content: Thought content normal.        Judgment: Judgment normal.     Ortho Exam  Specialty Comments:  No specialty comments available.  Imaging: XR Hand Complete Right Result Date: 09/13/2023 X-rays of the right hand show no acute or structural abnormalities.    PMFS History: Patient Active Problem List   Diagnosis Date Noted   Cough variant asthma 02/09/2016  Past Medical History:  Diagnosis Date   Asthma     Family History  Problem Relation Age of Onset   Asthma Mother    Allergies Mother    Emphysema Paternal Grandfather    Breast cancer Neg Hx     Past Surgical History:  Procedure Laterality Date   ENDOMETRIAL ABLATION     Social History   Occupational History   Not on file  Tobacco Use   Smoking status: Never   Smokeless tobacco: Never  Substance and Sexual Activity   Alcohol use: Yes    Alcohol/week: 8.0 standard drinks of alcohol    Types: 8 Glasses of wine per week   Drug use: No   Sexual activity: Not on file

## 2023-09-18 DIAGNOSIS — F411 Generalized anxiety disorder: Secondary | ICD-10-CM | POA: Diagnosis not present

## 2023-09-27 DIAGNOSIS — J301 Allergic rhinitis due to pollen: Secondary | ICD-10-CM | POA: Diagnosis not present

## 2023-09-27 DIAGNOSIS — J3089 Other allergic rhinitis: Secondary | ICD-10-CM | POA: Diagnosis not present

## 2023-09-27 DIAGNOSIS — J3081 Allergic rhinitis due to animal (cat) (dog) hair and dander: Secondary | ICD-10-CM | POA: Diagnosis not present

## 2023-09-28 DIAGNOSIS — M9903 Segmental and somatic dysfunction of lumbar region: Secondary | ICD-10-CM | POA: Diagnosis not present

## 2023-09-28 DIAGNOSIS — G5621 Lesion of ulnar nerve, right upper limb: Secondary | ICD-10-CM | POA: Diagnosis not present

## 2023-09-28 DIAGNOSIS — M5126 Other intervertebral disc displacement, lumbar region: Secondary | ICD-10-CM | POA: Diagnosis not present

## 2023-09-28 DIAGNOSIS — M9901 Segmental and somatic dysfunction of cervical region: Secondary | ICD-10-CM | POA: Diagnosis not present

## 2023-09-28 DIAGNOSIS — M9902 Segmental and somatic dysfunction of thoracic region: Secondary | ICD-10-CM | POA: Diagnosis not present

## 2023-10-03 DIAGNOSIS — M9903 Segmental and somatic dysfunction of lumbar region: Secondary | ICD-10-CM | POA: Diagnosis not present

## 2023-10-03 DIAGNOSIS — M9902 Segmental and somatic dysfunction of thoracic region: Secondary | ICD-10-CM | POA: Diagnosis not present

## 2023-10-03 DIAGNOSIS — G5621 Lesion of ulnar nerve, right upper limb: Secondary | ICD-10-CM | POA: Diagnosis not present

## 2023-10-03 DIAGNOSIS — M5126 Other intervertebral disc displacement, lumbar region: Secondary | ICD-10-CM | POA: Diagnosis not present

## 2023-10-04 DIAGNOSIS — J3081 Allergic rhinitis due to animal (cat) (dog) hair and dander: Secondary | ICD-10-CM | POA: Diagnosis not present

## 2023-10-04 DIAGNOSIS — J301 Allergic rhinitis due to pollen: Secondary | ICD-10-CM | POA: Diagnosis not present

## 2023-10-04 DIAGNOSIS — J3089 Other allergic rhinitis: Secondary | ICD-10-CM | POA: Diagnosis not present

## 2023-10-06 DIAGNOSIS — M9902 Segmental and somatic dysfunction of thoracic region: Secondary | ICD-10-CM | POA: Diagnosis not present

## 2023-10-06 DIAGNOSIS — M9903 Segmental and somatic dysfunction of lumbar region: Secondary | ICD-10-CM | POA: Diagnosis not present

## 2023-10-06 DIAGNOSIS — G5621 Lesion of ulnar nerve, right upper limb: Secondary | ICD-10-CM | POA: Diagnosis not present

## 2023-10-06 DIAGNOSIS — M5126 Other intervertebral disc displacement, lumbar region: Secondary | ICD-10-CM | POA: Diagnosis not present

## 2023-10-10 DIAGNOSIS — M9903 Segmental and somatic dysfunction of lumbar region: Secondary | ICD-10-CM | POA: Diagnosis not present

## 2023-10-10 DIAGNOSIS — M5126 Other intervertebral disc displacement, lumbar region: Secondary | ICD-10-CM | POA: Diagnosis not present

## 2023-10-10 DIAGNOSIS — G5621 Lesion of ulnar nerve, right upper limb: Secondary | ICD-10-CM | POA: Diagnosis not present

## 2023-10-10 DIAGNOSIS — M9902 Segmental and somatic dysfunction of thoracic region: Secondary | ICD-10-CM | POA: Diagnosis not present

## 2023-10-18 DIAGNOSIS — M9903 Segmental and somatic dysfunction of lumbar region: Secondary | ICD-10-CM | POA: Diagnosis not present

## 2023-10-18 DIAGNOSIS — M9902 Segmental and somatic dysfunction of thoracic region: Secondary | ICD-10-CM | POA: Diagnosis not present

## 2023-10-18 DIAGNOSIS — M5126 Other intervertebral disc displacement, lumbar region: Secondary | ICD-10-CM | POA: Diagnosis not present

## 2023-10-18 DIAGNOSIS — G5621 Lesion of ulnar nerve, right upper limb: Secondary | ICD-10-CM | POA: Diagnosis not present

## 2023-10-20 DIAGNOSIS — F411 Generalized anxiety disorder: Secondary | ICD-10-CM | POA: Diagnosis not present

## 2023-10-24 DIAGNOSIS — M9903 Segmental and somatic dysfunction of lumbar region: Secondary | ICD-10-CM | POA: Diagnosis not present

## 2023-10-24 DIAGNOSIS — M9902 Segmental and somatic dysfunction of thoracic region: Secondary | ICD-10-CM | POA: Diagnosis not present

## 2023-10-24 DIAGNOSIS — G5621 Lesion of ulnar nerve, right upper limb: Secondary | ICD-10-CM | POA: Diagnosis not present

## 2023-10-24 DIAGNOSIS — M5126 Other intervertebral disc displacement, lumbar region: Secondary | ICD-10-CM | POA: Diagnosis not present

## 2023-10-25 DIAGNOSIS — J301 Allergic rhinitis due to pollen: Secondary | ICD-10-CM | POA: Diagnosis not present

## 2023-10-25 DIAGNOSIS — J3089 Other allergic rhinitis: Secondary | ICD-10-CM | POA: Diagnosis not present

## 2023-10-25 DIAGNOSIS — J3081 Allergic rhinitis due to animal (cat) (dog) hair and dander: Secondary | ICD-10-CM | POA: Diagnosis not present

## 2023-10-30 DIAGNOSIS — M20012 Mallet finger of left finger(s): Secondary | ICD-10-CM | POA: Diagnosis not present

## 2023-10-31 DIAGNOSIS — G5621 Lesion of ulnar nerve, right upper limb: Secondary | ICD-10-CM | POA: Diagnosis not present

## 2023-10-31 DIAGNOSIS — M9903 Segmental and somatic dysfunction of lumbar region: Secondary | ICD-10-CM | POA: Diagnosis not present

## 2023-10-31 DIAGNOSIS — M25642 Stiffness of left hand, not elsewhere classified: Secondary | ICD-10-CM | POA: Diagnosis not present

## 2023-10-31 DIAGNOSIS — M5126 Other intervertebral disc displacement, lumbar region: Secondary | ICD-10-CM | POA: Diagnosis not present

## 2023-10-31 DIAGNOSIS — M20012 Mallet finger of left finger(s): Secondary | ICD-10-CM | POA: Diagnosis not present

## 2023-10-31 DIAGNOSIS — M9902 Segmental and somatic dysfunction of thoracic region: Secondary | ICD-10-CM | POA: Diagnosis not present

## 2023-11-07 DIAGNOSIS — M25642 Stiffness of left hand, not elsewhere classified: Secondary | ICD-10-CM | POA: Diagnosis not present

## 2023-11-08 DIAGNOSIS — J3089 Other allergic rhinitis: Secondary | ICD-10-CM | POA: Diagnosis not present

## 2023-11-08 DIAGNOSIS — J3081 Allergic rhinitis due to animal (cat) (dog) hair and dander: Secondary | ICD-10-CM | POA: Diagnosis not present

## 2023-11-08 DIAGNOSIS — J301 Allergic rhinitis due to pollen: Secondary | ICD-10-CM | POA: Diagnosis not present

## 2023-11-14 DIAGNOSIS — M9903 Segmental and somatic dysfunction of lumbar region: Secondary | ICD-10-CM | POA: Diagnosis not present

## 2023-11-14 DIAGNOSIS — M9902 Segmental and somatic dysfunction of thoracic region: Secondary | ICD-10-CM | POA: Diagnosis not present

## 2023-11-14 DIAGNOSIS — G5621 Lesion of ulnar nerve, right upper limb: Secondary | ICD-10-CM | POA: Diagnosis not present

## 2023-11-14 DIAGNOSIS — M5126 Other intervertebral disc displacement, lumbar region: Secondary | ICD-10-CM | POA: Diagnosis not present

## 2023-11-20 ENCOUNTER — Encounter: Payer: Self-pay | Admitting: Radiology

## 2023-11-28 DIAGNOSIS — M9903 Segmental and somatic dysfunction of lumbar region: Secondary | ICD-10-CM | POA: Diagnosis not present

## 2023-11-28 DIAGNOSIS — M9901 Segmental and somatic dysfunction of cervical region: Secondary | ICD-10-CM | POA: Diagnosis not present

## 2023-11-28 DIAGNOSIS — M5126 Other intervertebral disc displacement, lumbar region: Secondary | ICD-10-CM | POA: Diagnosis not present

## 2023-11-28 DIAGNOSIS — M9902 Segmental and somatic dysfunction of thoracic region: Secondary | ICD-10-CM | POA: Diagnosis not present

## 2023-11-29 DIAGNOSIS — J3081 Allergic rhinitis due to animal (cat) (dog) hair and dander: Secondary | ICD-10-CM | POA: Diagnosis not present

## 2023-11-29 DIAGNOSIS — J3089 Other allergic rhinitis: Secondary | ICD-10-CM | POA: Diagnosis not present

## 2023-11-29 DIAGNOSIS — J301 Allergic rhinitis due to pollen: Secondary | ICD-10-CM | POA: Diagnosis not present

## 2023-12-05 DIAGNOSIS — M20012 Mallet finger of left finger(s): Secondary | ICD-10-CM | POA: Diagnosis not present

## 2023-12-13 DIAGNOSIS — F411 Generalized anxiety disorder: Secondary | ICD-10-CM | POA: Diagnosis not present

## 2023-12-27 DIAGNOSIS — F411 Generalized anxiety disorder: Secondary | ICD-10-CM | POA: Diagnosis not present

## 2024-01-03 DIAGNOSIS — Z0000 Encounter for general adult medical examination without abnormal findings: Secondary | ICD-10-CM | POA: Diagnosis not present
